# Patient Record
Sex: Female | Born: 1975 | Race: White | Hispanic: No | Marital: Married | Smoking: Former smoker
Health system: Southern US, Community
[De-identification: ages and names within clinical notes are randomized; demographics above are authoritative.]

## PROBLEM LIST (undated history)

## (undated) ENCOUNTER — Inpatient Hospital Stay (HOSPITAL_COMMUNITY): Payer: Self-pay

## (undated) DIAGNOSIS — E079 Disorder of thyroid, unspecified: Secondary | ICD-10-CM

## (undated) DIAGNOSIS — F329 Major depressive disorder, single episode, unspecified: Secondary | ICD-10-CM

## (undated) DIAGNOSIS — E282 Polycystic ovarian syndrome: Secondary | ICD-10-CM

## (undated) DIAGNOSIS — F32A Depression, unspecified: Secondary | ICD-10-CM

## (undated) DIAGNOSIS — E119 Type 2 diabetes mellitus without complications: Secondary | ICD-10-CM

## (undated) DIAGNOSIS — E039 Hypothyroidism, unspecified: Secondary | ICD-10-CM

## (undated) DIAGNOSIS — K219 Gastro-esophageal reflux disease without esophagitis: Secondary | ICD-10-CM

## (undated) DIAGNOSIS — N809 Endometriosis, unspecified: Secondary | ICD-10-CM

## (undated) DIAGNOSIS — N189 Chronic kidney disease, unspecified: Secondary | ICD-10-CM

## (undated) DIAGNOSIS — K589 Irritable bowel syndrome without diarrhea: Secondary | ICD-10-CM

## (undated) DIAGNOSIS — F419 Anxiety disorder, unspecified: Secondary | ICD-10-CM

## (undated) HISTORY — PX: CHOLECYSTECTOMY: SHX55

## (undated) HISTORY — PX: OVARIAN CYST REMOVAL: SHX89

## (undated) HISTORY — PX: THYROIDECTOMY: SHX17

## (undated) HISTORY — PX: TOTAL THYROIDECTOMY: SHX2547

---

## 1998-01-28 ENCOUNTER — Emergency Department (HOSPITAL_COMMUNITY): Admission: EM | Admit: 1998-01-28 | Discharge: 1998-01-28 | Payer: Self-pay | Admitting: Emergency Medicine

## 1998-02-19 ENCOUNTER — Emergency Department (HOSPITAL_COMMUNITY): Admission: EM | Admit: 1998-02-19 | Discharge: 1998-02-19 | Payer: Self-pay | Admitting: Emergency Medicine

## 1998-05-14 ENCOUNTER — Encounter: Payer: Self-pay | Admitting: Emergency Medicine

## 1998-05-14 ENCOUNTER — Emergency Department (HOSPITAL_COMMUNITY): Admission: EM | Admit: 1998-05-14 | Discharge: 1998-05-14 | Payer: Self-pay | Admitting: Emergency Medicine

## 1998-05-18 ENCOUNTER — Emergency Department (HOSPITAL_COMMUNITY): Admission: EM | Admit: 1998-05-18 | Discharge: 1998-05-18 | Payer: Self-pay | Admitting: Emergency Medicine

## 1998-06-30 ENCOUNTER — Encounter: Payer: Self-pay | Admitting: Emergency Medicine

## 1998-06-30 ENCOUNTER — Emergency Department (HOSPITAL_COMMUNITY): Admission: EM | Admit: 1998-06-30 | Discharge: 1998-06-30 | Payer: Self-pay | Admitting: Emergency Medicine

## 1999-06-01 ENCOUNTER — Ambulatory Visit (HOSPITAL_COMMUNITY): Admission: RE | Admit: 1999-06-01 | Discharge: 1999-06-01 | Payer: Self-pay

## 1999-06-24 ENCOUNTER — Other Ambulatory Visit: Admission: RE | Admit: 1999-06-24 | Discharge: 1999-06-24 | Payer: Self-pay | Admitting: *Deleted

## 1999-07-17 ENCOUNTER — Other Ambulatory Visit: Admission: RE | Admit: 1999-07-17 | Discharge: 1999-07-17 | Payer: Self-pay | Admitting: *Deleted

## 1999-07-17 ENCOUNTER — Encounter (INDEPENDENT_AMBULATORY_CARE_PROVIDER_SITE_OTHER): Payer: Self-pay | Admitting: *Deleted

## 1999-10-28 ENCOUNTER — Other Ambulatory Visit: Admission: RE | Admit: 1999-10-28 | Discharge: 1999-10-28 | Payer: Self-pay | Admitting: *Deleted

## 2000-06-04 ENCOUNTER — Emergency Department (HOSPITAL_COMMUNITY): Admission: EM | Admit: 2000-06-04 | Discharge: 2000-06-04 | Payer: Self-pay | Admitting: Emergency Medicine

## 2000-08-16 ENCOUNTER — Other Ambulatory Visit: Admission: RE | Admit: 2000-08-16 | Discharge: 2000-08-16 | Payer: Self-pay | Admitting: Gastroenterology

## 2001-04-07 ENCOUNTER — Emergency Department (HOSPITAL_COMMUNITY): Admission: EM | Admit: 2001-04-07 | Discharge: 2001-04-07 | Payer: Self-pay | Admitting: Emergency Medicine

## 2001-04-07 ENCOUNTER — Encounter: Payer: Self-pay | Admitting: Emergency Medicine

## 2001-10-14 ENCOUNTER — Encounter: Payer: Self-pay | Admitting: Emergency Medicine

## 2001-10-14 ENCOUNTER — Emergency Department (HOSPITAL_COMMUNITY): Admission: EM | Admit: 2001-10-14 | Discharge: 2001-10-14 | Payer: Self-pay | Admitting: *Deleted

## 2004-04-09 ENCOUNTER — Emergency Department (HOSPITAL_COMMUNITY): Admission: EM | Admit: 2004-04-09 | Discharge: 2004-04-10 | Payer: Self-pay | Admitting: Emergency Medicine

## 2004-04-13 ENCOUNTER — Inpatient Hospital Stay (HOSPITAL_COMMUNITY): Admission: AD | Admit: 2004-04-13 | Discharge: 2004-04-14 | Payer: Self-pay | Admitting: Obstetrics & Gynecology

## 2004-04-21 ENCOUNTER — Inpatient Hospital Stay (HOSPITAL_COMMUNITY): Admission: AD | Admit: 2004-04-21 | Discharge: 2004-04-21 | Payer: Self-pay | Admitting: *Deleted

## 2004-04-28 ENCOUNTER — Inpatient Hospital Stay (HOSPITAL_COMMUNITY): Admission: AD | Admit: 2004-04-28 | Discharge: 2004-04-28 | Payer: Self-pay | Admitting: Obstetrics & Gynecology

## 2004-05-05 ENCOUNTER — Inpatient Hospital Stay (HOSPITAL_COMMUNITY): Admission: AD | Admit: 2004-05-05 | Discharge: 2004-05-05 | Payer: Self-pay | Admitting: *Deleted

## 2005-04-04 ENCOUNTER — Inpatient Hospital Stay (HOSPITAL_COMMUNITY): Admission: AD | Admit: 2005-04-04 | Discharge: 2005-04-04 | Payer: Self-pay | Admitting: Family Medicine

## 2007-11-30 ENCOUNTER — Encounter (INDEPENDENT_AMBULATORY_CARE_PROVIDER_SITE_OTHER): Payer: Self-pay | Admitting: Interventional Radiology

## 2007-11-30 ENCOUNTER — Other Ambulatory Visit: Admission: RE | Admit: 2007-11-30 | Discharge: 2007-11-30 | Payer: Self-pay | Admitting: Interventional Radiology

## 2007-11-30 ENCOUNTER — Encounter: Admission: RE | Admit: 2007-11-30 | Discharge: 2007-11-30 | Payer: Self-pay | Admitting: Endocrinology

## 2008-01-12 ENCOUNTER — Ambulatory Visit (HOSPITAL_COMMUNITY): Admission: RE | Admit: 2008-01-12 | Discharge: 2008-01-13 | Payer: Self-pay | Admitting: Surgery

## 2008-01-12 ENCOUNTER — Encounter (INDEPENDENT_AMBULATORY_CARE_PROVIDER_SITE_OTHER): Payer: Self-pay | Admitting: Surgery

## 2008-04-20 ENCOUNTER — Emergency Department (HOSPITAL_COMMUNITY): Admission: EM | Admit: 2008-04-20 | Discharge: 2008-04-20 | Payer: Self-pay | Admitting: Emergency Medicine

## 2010-12-08 NOTE — Op Note (Signed)
NAMESOMALY, MARTENEY           ACCOUNT NO.:  [id number]   MEDICAL RECORD NO.:  [id number]          PATIENT TYPE:  AMB   LOCATION:  DAY                          FACILITY:  [facility]   PHYSICIAN:  Velora Heckler, MD      DATE OF BIRTH:  July 05, 1976   DATE OF PROCEDURE:  01/12/2008  DATE OF DISCHARGE:                               OPERATIVE REPORT   PREOPERATIVE DIAGNOSIS:  Bilateral thyroid nodules, Hashimoto's  thyroiditis.   POSTOPERATIVE DIAGNOSIS:  Bilateral thyroid nodules, Hashimoto's  thyroiditis.   PROCEDURE:  Total thyroidectomy.   SURGEON:  Velora Heckler, MD, FACS   ASSISTANT:  Luretha Murphy, MD, FACS   ANESTHESIA:  General per Dr. Eilene Ghazi.   ESTIMATED BLOOD LOSS:  Minimal.   PREPARATION:  Betadine.   COMPLICATIONS:  None.   INDICATIONS:  The patient is a 35 year old white female from [place], [place].  She is referred by Dr. Ardyth Harps for bilateral  thyroid nodules with a background of Hashimoto's thyroiditis.  The  patient has been on thyroid hormone suppressive therapy.  Nodules have  increased on sequential ultrasound scanning.  Fine-needle aspiration had  shown follicular lesions with Hashimoto's thyroiditis.  The patient is  referred for thyroidectomy by her endocrinologist.   BODY OF REPORT:  Procedure was done in OR #11 at [facility].  The patient was brought to the operating room and placed in  supine position on the operating room table.  Following administration  of general anesthesia the patient was positioned and then prepped and  draped in the usual strict aseptic fashion.  After ascertaining that an  adequate level of anesthesia been achieved, a Kocher incision is made  with a #15 blade.  Dissection was carried down through subcutaneous  tissues and platysma.  Hemostasis was obtained with electrocautery.  Skin flaps were elevated cephalad and caudad from the thyroid notch to  the sternal notch.  A Muller  self-retaining retractor was placed for  exposure.  Strap muscles were incised in the midline and dissection is  carried down to the isthmus of the thyroid.  Strap muscles are reflected  to the left.  Left thyroid lobe was relatively small although it  contained multiple nodules, at least one of which may be calcified.  The  gland was gently mobilized although it was somewhat adherent to the  surrounding tissues.  There was inflammatory change.  There were some  small inflammatory lymph nodes.  The middle vein was divided between  hemoclips with the harmonic scalpel.  Inferior venous tributaries were  divided between hemoclips with the harmonic scalpel.  Superior blood  vessels were dissected out, ligated in continuity with 2-0 silk ties and  divided with the harmonic scalpel.  Both the superior and inferior  parathyroid glands were identified and preserved.  The gland was rolled  anteriorly.  The recurrent nerve was identified and preserved.  The  branches of the inferior thyroid artery were divided between small  hemoclips with the harmonic scalpel.  The gland was mobilized up and  onto the anterior trachea.  A small  pyramidal lobe was dissected out  with electrocautery and included with the specimen on block.   Next, we turned our attention to the right thyroid lobe.  The right lobe  was larger.  It contained dominant nodules at least one of which may be  calcified.  Strap muscles were reflected laterally.  Middle vein was  divided between medium hemoclips with the harmonic scalpel.  Inferior  venous tributaries were divided between medium hemoclips with the  harmonic scalpel.  Superior pole vessels were dissected out and divided  between medium hemoclips with the harmonic scalpel.  The gland was  rolled anteriorly.  There was a prominent tubercle of Zuckerkandl which  extended posteriorly.  It was gently dissected out.  Parathyroid tissue  adjacent to this tubercle was preserved on  its vascular pedicle.  Branches of the inferior thyroid artery were divided between small  hemoclips.  The gland was rolled further anteriorly.  The ligament of  Allyson Sabal was transected with electrocautery.  The gland was excised off the  anterior trachea wall using the electrocautery.  The entire gland was  excised.  A suture was used to mark the right superior pole.  The neck  is irrigated with warm saline and Surgicel was placed in the operative  field.  Strap muscles were reapproximated in the midline with  interrupted 3-0 Vicryl sutures.  Platysma was closed with interrupted 3-  0 Vicryl sutures.  Skin was closed with running 4-0 Monocryl  subcuticular suture.  The wound was washed and dried and Benzoin and  Steri-Strips are applied.  Sterile dressings were applied.  The patient  was awakened from anesthesia and brought to the recovery room in stable  condition.  The patient tolerated the procedure well.      Velora Heckler, MD  Electronically Signed     TMG/MEDQ  D:  01/12/2008  T:  01/12/2008  Job:  [id number]   cc:   Jeannett Senior A. Evlyn Kanner, M.D.  Fax: [phone]   Velora Heckler, MD  [phone] [address] [place]  [place] [address]

## 2011-04-22 LAB — URINALYSIS, ROUTINE W REFLEX MICROSCOPIC
Glucose, UA: NEGATIVE
Leukocytes, UA: NEGATIVE
pH: 6

## 2011-04-22 LAB — COMPREHENSIVE METABOLIC PANEL
ALT: 44 — ABNORMAL HIGH
AST: 32
Albumin: 3.7
Calcium: 9.1
GFR calc Af Amer: >60
Glucose, Bld: 94
Sodium: 138
Total Protein: 6.5

## 2011-04-22 LAB — DIFFERENTIAL
Eosinophils Absolute: 0.3
Lymphs Abs: 3.7
Monocytes Relative: 5
Neutrophils Relative %: 49

## 2011-04-22 LAB — URINE MICROSCOPIC-ADD ON

## 2011-04-22 LAB — CBC
MCHC: 34.7
Platelets: 248
RDW: 13.5

## 2011-04-22 LAB — PROTIME-INR: INR: 0.9

## 2011-07-25 ENCOUNTER — Emergency Department (HOSPITAL_COMMUNITY)
Admission: EM | Admit: 2011-07-25 | Discharge: 2011-07-26 | Disposition: A | Discharge status: Home / Self Care | Payer: Self-pay | Attending: Emergency Medicine | Admitting: Emergency Medicine

## 2011-07-25 ENCOUNTER — Encounter: Payer: Self-pay | Admitting: Emergency Medicine

## 2011-07-25 DIAGNOSIS — Z79899 Other long term (current) drug therapy: Secondary | ICD-10-CM | POA: Insufficient documentation

## 2011-07-25 DIAGNOSIS — E119 Type 2 diabetes mellitus without complications: Secondary | ICD-10-CM | POA: Insufficient documentation

## 2011-07-25 DIAGNOSIS — R10819 Abdominal tenderness, unspecified site: Secondary | ICD-10-CM | POA: Insufficient documentation

## 2011-07-25 DIAGNOSIS — F17200 Nicotine dependence, unspecified, uncomplicated: Secondary | ICD-10-CM | POA: Insufficient documentation

## 2011-07-25 DIAGNOSIS — Z9889 Other specified postprocedural states: Secondary | ICD-10-CM | POA: Insufficient documentation

## 2011-07-25 DIAGNOSIS — R109 Unspecified abdominal pain: Secondary | ICD-10-CM | POA: Insufficient documentation

## 2011-07-25 HISTORY — DX: Polycystic ovarian syndrome: E28.2

## 2011-07-25 NOTE — ED Notes (Signed)
Irregular periods- states she has had vaginal bleeding for 2 weeks and it was heavier today.

## 2011-07-25 NOTE — ED Notes (Signed)
C/o RLQ pain since waking up at 0830.  Intermittent nausea.  Denies vomiting.  Denies urinary complaints.

## 2011-07-25 NOTE — ED Notes (Signed)
Pt states began having RLQ pain this morning. Pt states pain has increased throughout the day. Pt states pain now radiates to right flank. Pt c/o intermittent nausea. Pt also c/o having her period for 2 weeks.

## 2011-07-25 NOTE — ED Provider Notes (Signed)
History     CSN: [id number]  Arrival date & time 07/25/11  2210   First MD Initiated Contact with Patient 07/25/11 2336      Chief Complaint  Patient presents with  . Abdominal Pain    (Consider location/radiation/quality/duration/timing/severity/associated sxs/prior treatment) HPI History provided by pt.   Pt woke this am w/ constant, severe RLQ pain that radiates to the right flank.  Has an intermittent stabbing sensation.   No associated fever, N/V/D, anorexia, urinary sx.  Has had her period for the past 2 weeks and bleeding heavier today, but otherwise no vaginal sx.  Periods are always irregular.  Has never had this pain before.  Past abd surgeries include cholecystectomy and ovarian cyst removal.  Had the ovarian cyst 25yrs ago and is unsure whether or not current sx similar.   Past Medical History  Diagnosis Date  . PCOS (polycystic ovarian syndrome)   . Diabetes mellitus     Past Surgical History  Procedure Date  . Cholecystectomy   . Total thyroidectomy   . Ovarian cyst removal     No family history on file.  History  Substance Use Topics  . Smoking status: Current Everyday Smoker  . Smokeless tobacco: Not on file  . Alcohol Use: Yes    OB History    Grav Para Term Preterm Abortions TAB SAB Ect Mult Living                  Review of Systems  All other systems reviewed and are negative.    Allergies  Demerol  Home Medications   Current Outpatient Rx  Name Route Sig Dispense Refill  . DIPHENHYDRAMINE HCL 25 MG PO TABS Oral Take 50 mg by mouth at bedtime.      Marland Kitchen LEVOTHYROXINE SODIUM 150 MCG PO TABS Oral Take 150 mcg by mouth daily.      Marland Kitchen METFORMIN HCL 1000 MG PO TABS Oral Take 1,000 mg by mouth 2 (two) times daily with a meal.        BP 120/86  Pulse 86  Temp(Src) 98 F (36.7 C) (Oral)  Resp 20  SpO2 99%  LMP 07/11/2011  Physical Exam  Nursing note and vitals reviewed. Constitutional: She is oriented to person, place, and time. She  appears well-developed and well-nourished. No distress.  HENT:  Head: Normocephalic and atraumatic.  Eyes:       Normal appearance  Neck: Normal range of motion.  Cardiovascular: Normal rate and regular rhythm.   Pulmonary/Chest: Effort normal and breath sounds normal.  Abdominal: Soft. Bowel sounds are normal. She exhibits no distension and no mass. There is no rebound and no guarding.       Mild RLQ and mod right suprapubic ttp.  Mild R CVA ttp  Genitourinary:       Nml external genitalia.  Vaginal bleeding.  Cervix closed and appears nml.  No cervical motion tenderness.  Mild R CVA ttp.   Neurological: She is alert and oriented to person, place, and time.  Skin: Skin is warm and dry. No rash noted.  Psychiatric: She has a normal mood and affect. Her behavior is normal.    ED Course  Procedures (including critical care time)  Labs Reviewed  WET PREP, GENITAL - Abnormal; Notable for the following:    Clue Cells, Wet Prep FEW (*)    All other components within normal limits  URINALYSIS, ROUTINE W REFLEX MICROSCOPIC - Abnormal; Notable for the following:    APPearance  CLOUDY (*)    Hgb urine dipstick LARGE (*)    All other components within normal limits  URINE MICROSCOPIC-ADD ON - Abnormal; Notable for the following:    Squamous Epithelial / LPF FEW (*)    All other components within normal limits  POCT PREGNANCY, URINE  GC/CHLAMYDIA PROBE AMP, GENITAL  POCT PREGNANCY, URINE  CBC  I-STAT, CHEM 8   US Transvaginal Non-ob  07/26/2011  *RADIOLOGY REPORT*  Clinical Data: Right suprapubic pain.  Right lower quadrant pain. Bleeding for 2 weeks.  Gravida 2, para 0.  LMP 07/10/2011.  TRANSABDOMINAL AND TRANSVAGINAL ULTRASOUND OF PELVIS Technique:  Both transabdominal and transvaginal ultrasound examinations of the pelvis were performed. Transabdominal technique was performed for global imaging of the pelvis including uterus, ovaries, adnexal regions, and pelvic cul-de-sac.  Comparison:  OB ultrasound 04/14/2004   It was necessary to proceed with endovaginal exam following the transabdominal exam to visualize the uterus and ovaries.  Findings:  Uterus: The uterus is 6.6 x 2.9 x 3.5 cm.  No uterine mass identified.  Endometrium: The endometrium is 6.4 mm.  No focal lesion identified.  Right ovary:  The right ovary is 3.4 x 2.9 x 2.8 cm.  A right ovarian follicle is 2.7 cm.  Left ovary: Left ovary is 3.1 x 2.2 x 3.2 cm.  Follicle is 1.5 cm.  Other findings: There is a trace of free pelvic fluid.  IMPRESSION:  1.Normal pelvic ultrasound. 2.  No evidence for adnexal mass or significant free pelvic fluid.  Original Report Authenticated By: Patterson Hammersmith, M.D.   US Pelvis Complete  07/26/2011  *RADIOLOGY REPORT*  Clinical Data: Right suprapubic pain.  Right lower quadrant pain. Bleeding for 2 weeks.  Gravida 2, para 0.  LMP 07/10/2011.  TRANSABDOMINAL AND TRANSVAGINAL ULTRASOUND OF PELVIS Technique:  Both transabdominal and transvaginal ultrasound examinations of the pelvis were performed. Transabdominal technique was performed for global imaging of the pelvis including uterus, ovaries, adnexal regions, and pelvic cul-de-sac.  Comparison: OB ultrasound 04/14/2004   It was necessary to proceed with endovaginal exam following the transabdominal exam to visualize the uterus and ovaries.  Findings:  Uterus: The uterus is 6.6 x 2.9 x 3.5 cm.  No uterine mass identified.  Endometrium: The endometrium is 6.4 mm.  No focal lesion identified.  Right ovary:  The right ovary is 3.4 x 2.9 x 2.8 cm.  A right ovarian follicle is 2.7 cm.  Left ovary: Left ovary is 3.1 x 2.2 x 3.2 cm.  Follicle is 1.5 cm.  Other findings: There is a trace of free pelvic fluid.  IMPRESSION:  1.Normal pelvic ultrasound. 2.  No evidence for adnexal mass or significant free pelvic fluid.  Original Report Authenticated By: Patterson Hammersmith, M.D.     No diagnosis found.    MDM  Pt presents w/ RLQ pain w/ associated vaginal  bleeding.  H/o large, surgically resected ovarian cyst.  RLQ, R suprapubic and R adnexal ttp on exam.  Transvaginal US neg.  CT abd/pelvis pending to r/o appendicitis.  Will give patient a second dose of pain medication.  Dr. Hyacinth Meeker to dispo.         Otilio Miu, [place] 07/27/11 [phone]

## 2011-07-26 ENCOUNTER — Emergency Department (HOSPITAL_COMMUNITY): Payer: Self-pay

## 2011-07-26 ENCOUNTER — Encounter (HOSPITAL_COMMUNITY): Payer: Self-pay | Admitting: Radiology

## 2011-07-26 LAB — URINALYSIS, ROUTINE W REFLEX MICROSCOPIC
Bilirubin Urine: NEGATIVE
Specific Gravity, Urine: 1.02 (ref 1.005–1.030)
Urobilinogen, UA: 0.2 mg/dL (ref 0.0–1.0)
pH: 6 (ref 5.0–8.0)

## 2011-07-26 LAB — POCT I-STAT, CHEM 8
BUN: 15 mg/dL (ref 6–23)
Chloride: 107 mEq/L (ref 96–112)
HCT: 40 % (ref 36.0–46.0)
Potassium: 3.8 mEq/L (ref 3.5–5.1)

## 2011-07-26 LAB — URINE MICROSCOPIC-ADD ON

## 2011-07-26 LAB — CBC
Platelets: 214 10*3/uL (ref 150–400)
RBC: 4.52 MIL/uL (ref 3.87–5.11)
WBC: 10 10*3/uL (ref 4.0–10.5)

## 2011-07-26 LAB — WET PREP, GENITAL
Trich, Wet Prep: NONE SEEN
Yeast Wet Prep HPF POC: NONE SEEN

## 2011-07-26 LAB — GC/CHLAMYDIA PROBE AMP, GENITAL: GC Probe Amp, Genital: NEGATIVE

## 2011-07-26 MED ORDER — NAPROXEN 500 MG PO TABS: 500.0000 mg | ORAL_TABLET | Freq: Two times a day (BID) | ORAL | Status: DC

## 2011-07-26 MED ORDER — MORPHINE SULFATE 4 MG/ML IJ SOLN
4.0000 mg | Freq: Once | INTRAMUSCULAR | Status: AC
  Administered 2011-07-26: 4 mg via INTRAVENOUS
  Filled 2011-07-26 (×2): qty 1

## 2011-07-26 MED ORDER — ONDANSETRON HCL 4 MG/2ML IJ SOLN
INTRAMUSCULAR | Status: AC
  Filled 2011-07-26: qty 2

## 2011-07-26 MED ORDER — OXYCODONE-ACETAMINOPHEN 5-325 MG PO TABS
1.0000 | ORAL_TABLET | Freq: Once | ORAL | Status: AC
  Administered 2011-07-26: 1 via ORAL
  Filled 2011-07-26: qty 1

## 2011-07-26 MED ORDER — ONDANSETRON HCL 4 MG/2ML IJ SOLN
INTRAMUSCULAR | Status: AC
  Administered 2011-07-26: 4 mg
  Filled 2011-07-26: qty 2

## 2011-07-26 MED ORDER — OXYCODONE-ACETAMINOPHEN 5-325 MG PO TABS: 1.0000 | ORAL_TABLET | ORAL | Status: AC | PRN

## 2011-07-26 MED ORDER — PROMETHAZINE HCL 25 MG PO TABS: 25.0000 mg | ORAL_TABLET | Freq: Four times a day (QID) | ORAL | Status: AC | PRN

## 2011-07-26 MED ORDER — IOHEXOL 300 MG/ML  SOLN
100.0000 mL | Freq: Once | INTRAMUSCULAR | Status: AC | PRN
  Administered 2011-07-26: 100 mL via INTRAVENOUS

## 2011-07-26 NOTE — ED Notes (Signed)
Pt doesn't want pain meds at this. Pt concerned pain meds will make her sick. Pt attempting to drink contrast at this time.

## 2011-07-26 NOTE — ED Notes (Signed)
Pt still attempting to drink contrast.

## 2011-07-26 NOTE — ED Notes (Signed)
Patient transported to Ultrasound 

## 2011-07-27 NOTE — ED Provider Notes (Signed)
Medical screening examination/treatment/procedure(s) were conducted as a shared visit with non-physician practitioner(s) and myself.  I personally evaluated the patient during the encounter  I have fully evaluated the patient and found her to have no significant findings of pathologic abdominal pain. Her abdominal exam is benign at this time her pain has improved during her course in the emergency department. She has been informed of her CT scan and ultrasound results as well as blood and urine results. I believe that her pain may have been from a kidney stone which has passed.  Otherwise patient appears well with normal lung and heart exam, soft and nontender abdomen and at this time patient is stable for discharge     Vida Roller, MD 07/27/11 [phone]

## 2012-01-28 ENCOUNTER — Encounter (HOSPITAL_COMMUNITY): Payer: Self-pay | Admitting: *Deleted

## 2012-01-28 ENCOUNTER — Inpatient Hospital Stay (HOSPITAL_COMMUNITY)
Admission: AD | Admit: 2012-01-28 | Discharge: 2012-01-28 | Disposition: A | Discharge status: Home / Self Care | Payer: BC Managed Care – PPO | Source: Ambulatory Visit | Attending: Obstetrics & Gynecology | Admitting: Obstetrics & Gynecology

## 2012-01-28 DIAGNOSIS — R109 Unspecified abdominal pain: Secondary | ICD-10-CM | POA: Insufficient documentation

## 2012-01-28 DIAGNOSIS — N949 Unspecified condition associated with female genital organs and menstrual cycle: Secondary | ICD-10-CM

## 2012-01-28 DIAGNOSIS — R102 Pelvic and perineal pain: Secondary | ICD-10-CM

## 2012-01-28 DIAGNOSIS — N979 Female infertility, unspecified: Secondary | ICD-10-CM | POA: Insufficient documentation

## 2012-01-28 HISTORY — DX: Major depressive disorder, single episode, unspecified: F32.9

## 2012-01-28 HISTORY — DX: Depression, unspecified: F32.A

## 2012-01-28 HISTORY — DX: Chronic kidney disease, unspecified: N18.9

## 2012-01-28 HISTORY — DX: Anxiety disorder, unspecified: F41.9

## 2012-01-28 HISTORY — DX: Endometriosis, unspecified: N80.9

## 2012-01-28 LAB — URINALYSIS, ROUTINE W REFLEX MICROSCOPIC
Leukocytes, UA: NEGATIVE
Nitrite: NEGATIVE
Specific Gravity, Urine: >1.03 — ABNORMAL HIGH (ref 1.005–1.030)
pH: 5.5 (ref 5.0–8.0)

## 2012-01-28 LAB — POCT PREGNANCY, URINE: Preg Test, Ur: NEGATIVE

## 2012-01-28 LAB — HCG, SERUM, QUALITATIVE: Preg, Serum: NEGATIVE

## 2012-01-28 MED ORDER — IBUPROFEN 600 MG PO TABS
600.0000 mg | ORAL_TABLET | Freq: Once | ORAL | Status: AC
  Administered 2012-01-28: 600 mg via ORAL
  Filled 2012-01-28: qty 1

## 2012-01-28 NOTE — MAU Note (Signed)
This weekend is end of 2 wks wait to see if preg.  Has been cramping last couple days, became severe today.  Has been seeing fertility dr, on fertility meds.

## 2012-01-28 NOTE — MAU Provider Note (Signed)
History     CSN: [id number]  Arrival date and time: 01/28/12 1452   First Provider Initiated Contact with Patient 01/28/12 1549      Chief Complaint  Patient presents with  . Abdominal Pain   HPI This is a 36 y.o. female who presents with cramping. She is currently undergoing fertility treatment with Dr Elesa Hacker.  She took Femara this month and is utilizing natuaral intercourse. Had HCG shot 2 wks ago. Due to take her UPT tomorrow. No bleeding. No fever or other problems. Was just worried she was having a miscarriage.  OB History    Grav Para Term Preterm Abortions TAB SAB Ect Mult Living   2 0 0 0 2 0 2 0 0 0       Past Medical History  Diagnosis Date  . PCOS (polycystic ovarian syndrome)   . 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 Diabetes mellitus     insulin resistance w/ PCOS  . Chronic kidney disease     kidney stones  . Anxiety   . Depression     doing good, no meds in 2 yrs.  . Endometriosis     Past Surgical History  Procedure Date  . Cholecystectomy   . Total thyroidectomy   . Ovarian cyst removal     Family History  Problem Relation Age of Onset  . Other Neg Hx     History  Substance Use Topics  . Smoking status: Current Everyday Smoker -- 0.2 packs/day for 15 years    Types: Cigarettes  . Smokeless tobacco: Never Used  . Alcohol Use: Yes     not now    Allergies:  Allergies  Allergen Reactions  . Demerol Anaphylaxis  . Hydrocodone Nausea Only  . Percocet (Oxycodone-Acetaminophen) Nausea Only  . Tramadol Nausea Only    Prescriptions prior to admission  Medication Sig Dispense Refill  . acetaminophen (TYLENOL) 500 MG tablet Take 1,000 mg by mouth daily as needed. cramps      . human chorionic gonadotropin (PREGNYL/NOVAREL) 10000 UNITS injection Inject 10,000 Units into the muscle once. Two weeks after period.      Marland Kitchen letrozole (FEMARA) 2.5 MG tablet Take 5 mg by mouth daily. Day 3 of period start for 5 days each month      .  levothyroxine (SYNTHROID, LEVOTHROID) 175 MCG tablet Take 175 mcg by mouth daily.      . Prenatal Vit-Fe Fumarate-FA (PRENATAL MULTIVITAMIN) TABS Take 1 tablet by mouth at bedtime.      . progesterone (PROMETRIUM) 200 MG capsule Take 200 mg by mouth at bedtime. Until positive pregnancy, or until period starts.        ROS As listed inHPI  Physical Exam   Blood pressure 120/84, pulse 87, temperature 99.8 F (37.7 C), temperature source Oral, resp. rate 20, height 5' 6.5" (1.689 m), weight 221 lb (100.245 kg), last menstrual period 01/02/2012.  Physical Exam  Constitutional: She is oriented to person, place, and time. She appears well-developed and well-nourished. No distress.  Cardiovascular: Normal rate.   Respiratory: Effort normal.  GI: Soft. There is no tenderness.  Genitourinary: Vagina normal and uterus normal. No vaginal discharge found.       Cervix long and closed  Musculoskeletal: Normal range of motion.  Neurological: She is alert and oriented to person, place, and time.  Skin: Skin is warm and dry.  Psychiatric: She has a normal mood and affect.   UPT neg.  MAU  Course  Procedures  MDM Will get serum Qualitative HCG >> Negative  Assessment and Plan  A;  Infertility, negative pregnancy test      Cramping, probably oncoming menses  P:  Discharge home      Follow up with Dr Clint Bolder 01/28/2012, 3:49 PM

## 2012-01-28 NOTE — MAU Note (Deleted)
Fetal movement noted by RN when placing on monitor, asked pt- no response

## 2012-01-30 NOTE — MAU Provider Note (Signed)
Attestation of Attending Supervision of Advanced Practitioner (CNM/NP): Evaluation and management procedures were performed by the Advanced Practitioner under my supervision and collaboration.  I have reviewed the Advanced Practitioner's note and chart, and I agree with the management and plan.  Jaynie Collins, M.D. 01/30/2012 7:45 AM

## 2012-05-18 ENCOUNTER — Encounter (HOSPITAL_COMMUNITY): Payer: Self-pay | Admitting: *Deleted

## 2012-05-18 ENCOUNTER — Inpatient Hospital Stay (HOSPITAL_COMMUNITY)
Admission: AD | Admit: 2012-05-18 | Discharge: 2012-05-18 | Disposition: A | Discharge status: Home / Self Care | Payer: BC Managed Care – PPO | Source: Ambulatory Visit | Attending: Obstetrics and Gynecology | Admitting: Obstetrics and Gynecology

## 2012-05-18 DIAGNOSIS — R350 Frequency of micturition: Secondary | ICD-10-CM

## 2012-05-18 DIAGNOSIS — N949 Unspecified condition associated with female genital organs and menstrual cycle: Secondary | ICD-10-CM | POA: Insufficient documentation

## 2012-05-18 DIAGNOSIS — O99891 Other specified diseases and conditions complicating pregnancy: Secondary | ICD-10-CM | POA: Insufficient documentation

## 2012-05-18 HISTORY — DX: Hypothyroidism, unspecified: E03.9

## 2012-05-18 LAB — URINALYSIS, ROUTINE W REFLEX MICROSCOPIC
Glucose, UA: NEGATIVE mg/dL
Ketones, ur: NEGATIVE mg/dL
Leukocytes, UA: NEGATIVE
Nitrite: NEGATIVE
Protein, ur: NEGATIVE mg/dL
Urobilinogen, UA: 0.2 mg/dL (ref 0.0–1.0)

## 2012-05-18 NOTE — MAU Note (Addendum)
Pelvic pressure first noted on Monday.  Reports frequency with urination, no pain.  No bleeding.  Worried due to hx of miscarriage.  Has had 7 pos HPT.

## 2012-05-18 NOTE — MAU Provider Note (Signed)
Attestation of Attending Supervision of Advanced Practitioner (CNM/NP): Evaluation and management procedures were performed by the Advanced Practitioner under my supervision and collaboration.  I have reviewed the Advanced Practitioner's note and chart, and I agree with the management and plan.  Shabrea Weldin 05/18/2012 4:36 PM   

## 2012-05-18 NOTE — MAU Provider Note (Signed)
History     CSN: [id number]  Arrival date and time: 05/18/12 4098   First Provider Initiated Contact with Patient 05/18/12 1105      Chief Complaint  Patient presents with  . pelvic pressure    HPIJennifer B Scott is 36 y.o. G3P0020 [redacted]w[redacted]d weeks presenting with pressure, sharp twinges and frequency of urination. She has a history of miscarriages X2 "about this time".  She is concerned about that possibility of miscarriage or UTI. She is here to rule out UTI.  SHe has had 8 + pregnancy tests at home. LMP 04/20/12.  Normal menses for her.  She is a patient of Dr. Elesa Hacker.  Was seen in their lab today for BHCG and progesterone levels.   She is using Progesterone 200mg  po at hs.  Negative for vaginal bleeding or abnormal discharge.   Past Medical History  Diagnosis Date  . PCOS (polycystic ovarian syndrome)   . Diabetes mellitus     insulin resistance w/ PCOS  . Chronic kidney disease     kidney stones  . Anxiety   . Depression     doing good, no meds in 2 yrs.  . Endometriosis   . Hypothyroid     Past Surgical History  Procedure Date  . Cholecystectomy   . Total thyroidectomy   . Ovarian cyst removal     Family History  Problem Relation Age of Onset  . Other Neg Hx   . Diabetes Mother   . Hypertension Father     History  Substance Use Topics  . Smoking status: Current Every Day Smoker -- 0.2 packs/day for 15 years    Types: Cigarettes  . Smokeless tobacco: Never Used  . Alcohol Use: Yes     not now    Allergies:  Allergies  Allergen Reactions  . Demerol Anaphylaxis  . Hydrocodone Nausea Only  . Percocet (Oxycodone-Acetaminophen) Nausea Only  . Tramadol Nausea Only    Prescriptions prior to admission  Medication Sig Dispense Refill  . acetaminophen (TYLENOL) 500 MG tablet Take 1,000 mg by mouth daily as needed. cramps      . human chorionic gonadotropin (PREGNYL/NOVAREL) 10000 UNITS injection Inject 10,000 Units into the muscle once. Two weeks after  period. Last used 05-01-12      . letrozole (FEMARA) 2.5 MG tablet Take 5 mg by mouth daily. Day 3 of period start for 5 days each month sept 28 and took for 5 days      . levothyroxine (SYNTHROID, LEVOTHROID) 175 MCG tablet Take 175 mcg by mouth daily.      . Prenatal Vit-Fe Fumarate-FA (PRENATAL MULTIVITAMIN) TABS Take 1 tablet by mouth at bedtime.      . progesterone (PROMETRIUM) 200 MG capsule Take 200 mg by mouth at bedtime. Until positive pregnancy, or until period starts. Took Oct 10,2013 and still taking        Review of Systems  Constitutional: Negative for fever and chills.  HENT: Negative.   Gastrointestinal: Positive for abdominal pain (sharp twinges occasionally).  Genitourinary: Positive for frequency. Negative for dysuria, urgency and hematuria.   Physical Exam   Blood pressure 125/81, pulse 80, temperature 98.4 F (36.9 C), temperature source Oral, resp. rate 18, height 5\' 7"  (1.702 m), weight 100.245 kg (221 lb), last menstrual period 04/20/2012.  Physical Exam  Constitutional: She is oriented to person, place, and time. She appears well-developed and well-nourished. No distress.  HENT:  Head: Normocephalic.  Neck: Normal range of motion.  Cardiovascular: Normal  rate.   Respiratory: Effort normal.  GI: Soft. She exhibits no distension and no mass. There is tenderness ("pressure"  feeling over the bladder). There is no rebound and no guarding.  Genitourinary:       Not indicated  Neurological: She is alert and oriented to person, place, and time.  Skin: Skin is warm and dry.  Psychiatric: She has a normal mood and affect. Her behavior is normal.   Results for orders placed during the hospital encounter of 05/18/12 (from the past 24 hour(s))  URINALYSIS, ROUTINE W REFLEX MICROSCOPIC     Status: Abnormal   Collection Time   05/18/12  9:57 AM      Component Value Range   Color, Urine YELLOW  YELLOW   APPearance CLEAR  CLEAR   Specific Gravity, Urine >1.030 (*) 1.005  - 1.030   pH 6.0  5.0 - 8.0   Glucose, UA NEGATIVE  NEGATIVE mg/dL   Hgb urine dipstick NEGATIVE  NEGATIVE   Bilirubin Urine NEGATIVE  NEGATIVE   Ketones, ur NEGATIVE  NEGATIVE mg/dL   Protein, ur NEGATIVE  NEGATIVE mg/dL   Urobilinogen, UA 0.2  0.0 - 1.0 mg/dL   Nitrite NEGATIVE  NEGATIVE   Leukocytes, UA NEGATIVE  NEGATIVE  POCT PREGNANCY, URINE     Status: Abnormal   Collection Time   05/18/12 11:18 AM      Component Value Range   Preg Test, Ur POSITIVE (*) NEGATIVE   MAU Course  Procedures  MDM Reviewed UA with patient.  Reassured not UTI  Assessment and Plan  A:  Negative Urinalysis  P:  Encouraged patient to continue care with Dr. Elesa Hacker until he refers her to [facility] here.       Madeline Scott,EVE M 05/18/2012, 11:52 AM

## 2012-05-28 ENCOUNTER — Encounter (HOSPITAL_COMMUNITY): Payer: Self-pay | Admitting: *Deleted

## 2012-05-28 ENCOUNTER — Inpatient Hospital Stay (HOSPITAL_COMMUNITY)
Admission: AD | Admit: 2012-05-28 | Discharge: 2012-05-28 | Disposition: A | Discharge status: Home / Self Care | Payer: BC Managed Care – PPO | Source: Ambulatory Visit | Attending: Obstetrics & Gynecology | Admitting: Obstetrics & Gynecology

## 2012-05-28 ENCOUNTER — Inpatient Hospital Stay (HOSPITAL_COMMUNITY): Payer: BC Managed Care – PPO

## 2012-05-28 DIAGNOSIS — O99891 Other specified diseases and conditions complicating pregnancy: Secondary | ICD-10-CM | POA: Insufficient documentation

## 2012-05-28 DIAGNOSIS — R109 Unspecified abdominal pain: Secondary | ICD-10-CM

## 2012-05-28 DIAGNOSIS — O26899 Other specified pregnancy related conditions, unspecified trimester: Secondary | ICD-10-CM

## 2012-05-28 DIAGNOSIS — R1031 Right lower quadrant pain: Secondary | ICD-10-CM | POA: Insufficient documentation

## 2012-05-28 LAB — CBC WITH DIFFERENTIAL/PLATELET
Basophils Absolute: 0.1 10*3/uL (ref 0.0–0.1)
HCT: 39.1 % (ref 36.0–46.0)
Lymphocytes Relative: 37 % (ref 12–46)
Monocytes Absolute: 0.5 10*3/uL (ref 0.1–1.0)
Neutro Abs: 7.4 10*3/uL (ref 1.7–7.7)
RDW: 13.4 % (ref 11.5–15.5)
WBC: 13.2 10*3/uL — ABNORMAL HIGH (ref 4.0–10.5)

## 2012-05-28 LAB — WET PREP, GENITAL: Yeast Wet Prep HPF POC: NONE SEEN

## 2012-05-28 LAB — HCG, QUANTITATIVE, PREGNANCY: hCG, Beta Chain, Quant, S: 2706 m[IU]/mL — ABNORMAL HIGH (ref <5)

## 2012-05-28 NOTE — MAU Provider Note (Signed)
History     CSN: [id number]  Arrival date and time: 05/28/12 2043   None     Chief Complaint  Patient presents with  . Abdominal Pain   HPI Madeline Scott is a 36 y.o. female @ [redacted]w[redacted]d gestation who presents to MAU with abdominal pain. The pain started today. The pain is located in the right lower quadrant. She rates the pain as 7/10. She denies vaginal bleeding or discharge. She is concerned because she has had 2 miscarriages. She goes to an infertility office. She is currently taking Progesterone. The history was provided by the patient.  OB History    Grav Para Term Preterm Abortions TAB SAB Ect Mult Living   3 0 0 0 2 0 2 0 0 0       Past Medical History  Diagnosis Date  . PCOS (polycystic ovarian syndrome)   . Diabetes mellitus     insulin resistance w/ PCOS  . Chronic kidney disease     kidney stones  . Anxiety   . Depression     doing good, no meds in 2 yrs.  . Endometriosis   . Hypothyroid     Past Surgical History  Procedure Date  . Cholecystectomy   . Total thyroidectomy   . Ovarian cyst removal     Family History  Problem Relation Age of Onset  . Other Neg Hx   . Diabetes Mother   . Hypertension Father     History  Substance Use Topics  . Smoking status: Former Smoker -- 0.2 packs/day for 15 years    Types: Cigarettes    Quit date: 05/16/2012  . Smokeless tobacco: Never Used  . Alcohol Use: Yes     Comment: not now    Allergies:  Allergies  Allergen Reactions  . Demerol Anaphylaxis  . Hydrocodone Nausea Only  . Percocet (Oxycodone-Acetaminophen) Nausea Only  . Tramadol Nausea Only    Prescriptions prior to admission  Medication Sig Dispense Refill  . acetaminophen (TYLENOL) 500 MG tablet Take 500 mg by mouth daily as needed. cramps      . amoxicillin (AMOXIL) 500 MG capsule Take 500 mg by mouth 3 (three) times daily. Started 05/27/12 x 10 days      . levothyroxine (SYNTHROID, LEVOTHROID) 175 MCG tablet Take 175 mcg by mouth daily.       . Prenatal Vit-Fe Fumarate-FA (PRENATAL MULTIVITAMIN) TABS Take 1 tablet by mouth at bedtime.      . progesterone (PROMETRIUM) 200 MG capsule Take 400 mg by mouth at bedtime. Until positive pregnancy, or until period starts. Took Oct 10,2013 and still taking        Review of Systems  Constitutional: Negative for fever and chills.  Eyes: Negative for blurred vision and double vision.  Respiratory: Negative for cough and wheezing.   Cardiovascular: Negative for chest pain.  Gastrointestinal: Positive for abdominal pain. Negative for vomiting and diarrhea.  Genitourinary: Positive for frequency. Negative for dysuria and urgency.  Musculoskeletal: Negative for back pain.  Skin: Negative for rash.  Neurological: Negative for dizziness and headaches.  Psychiatric/Behavioral: Negative for depression. The patient is not nervous/anxious.    Physical Exam   Blood pressure 130/67, pulse 107, temperature 97.9 F (36.6 C), temperature source Oral, resp. rate 18, height 5' 6.6" (1.692 m), weight 226 lb 6.4 oz (102.694 kg), last menstrual period 04/20/2012.  Physical Exam  Nursing note and vitals reviewed. Constitutional: She is oriented to person, place, and time. She appears well-developed and  well-nourished. No distress.  HENT:  Head: Normocephalic and atraumatic.  Eyes: EOM are normal.  Neck: Neck supple.  Cardiovascular: Normal rate.   Respiratory: Effort normal.  GI: Soft. Normal appearance. There is tenderness in the right lower quadrant. There is no rigidity, no rebound and no guarding.  Genitourinary:       External genitalia without lesions. Mucous discharge vaginal vault. No bleeding noted. Cervix closed, long, no CMT, right adnexal tenderness. Uterus without palpable enlargement.  Musculoskeletal: Normal range of motion.  Neurological: She is alert and oriented to person, place, and time.  Skin: Skin is warm and dry.  Psychiatric: She has a normal mood and affect. Her behavior is  normal. Judgment and thought content normal.   Results for orders placed during the hospital encounter of 05/28/12 (from the past 24 hour(s))  CBC WITH DIFFERENTIAL     Status: Abnormal   Collection Time   05/28/12  9:05 PM      Component Value Range   WBC 13.2 (*) 4.0 - 10.5 K/uL   RBC 4.52  3.87 - 5.11 MIL/uL   Hemoglobin 13.8  12.0 - 15.0 g/dL   HCT 96.0  45.4 - 09.8 %   MCV 86.5  78.0 - 100.0 fL   MCH 30.5  26.0 - 34.0 pg   MCHC 35.3  30.0 - 36.0 g/dL   RDW 11.9  14.7 - 82.9 %   Platelets 265  150 - 400 K/uL   Neutrophils Relative 56  43 - 77 %   Neutro Abs 7.4  1.7 - 7.7 K/uL   Lymphocytes Relative 37  12 - 46 %   Lymphs Abs 4.9 (*) 0.7 - 4.0 K/uL   Monocytes Relative 4  3 - 12 %   Monocytes Absolute 0.5  0.1 - 1.0 K/uL   Eosinophils Relative 3  0 - 5 %   Eosinophils Absolute 0.4  0.0 - 0.7 K/uL   Basophils Relative 0  0 - 1 %   Basophils Absolute 0.1  0.0 - 0.1 K/uL  HCG, QUANTITATIVE, PREGNANCY     Status: Abnormal   Collection Time   05/28/12  9:05 PM      Component Value Range   hCG, Beta Chain, Quant, S 2706 (*) <5 mIU/mL  ABO/RH     Status: Normal (Preliminary result)   Collection Time   05/28/12  9:05 PM      Component Value Range   ABO/RH(D) A POS    WET PREP, GENITAL     Status: Abnormal   Collection Time   05/28/12 10:01 PM      Component Value Range   Yeast Wet Prep HPF POC NONE SEEN  NONE SEEN   Trich, Wet Prep NONE SEEN  NONE SEEN   Clue Cells Wet Prep HPF POC FEW (*) NONE SEEN   WBC, Wet Prep HPF POC MODERATE (*) NONE SEEN   US Ob Comp Less 14 Wks  05/28/2012  *RADIOLOGY REPORT*  Clinical Data: 36 year old G3 P0 AB2, LMP 04/20/2012 (5 weeks 3 days), quantitative beta HCG 2706, presenting with right lower quadrant abdominal pain and right-sided pelvic pain.  OBSTETRIC <14 WK [place] AND TRANSVAGINAL OB US  Technique:  Both transabdominal and transvaginal ultrasound examinations were performed for complete evaluation of the gestation as well as the maternal  uterus, adnexal regions, and pelvic cul-de-sac.  Transvaginal technique was performed to assess early pregnancy.  Comparison:  None.  Intrauterine gestational sac:  Single, normal in shape. Yolk sac:  Visualized. Embryo: Not visualized. Cardiac Activity: Not visualized. Heart Rate: Not applicable.  MSD: 5.1 mm  5 w 0 d [place] EDC: 01/28/2013  Maternal uterus/adnexae: Normal decidual reaction.  No evidence of subchorionic hemorrhage. Right ovary normal in size and appearance, containing small follicular cysts, measuring approximately 4.1 x 2.6 x 2.4 cm.  Left ovary normal in size and appearance, containing small follicular cysts, measuring approximately 3.7 x 2.2 x 2.3 cm.  No adnexal masses or free pelvic fluid.  IMPRESSION:  1.  Single intrauterine gestational sac containing a yolk sac.  No visible embryo at this time due to early gestational age. Estimated gestational age by mean sac diameter is 5 weeks 0 days. 2.  No evidence of subchorionic hemorrhage. 3.  Normal-appearing ovaries.  No adnexal masses or free pelvic fluid.   Original Report Authenticated By: Hulan Saas, M.D.    US Ob Transvaginal  05/28/2012  *RADIOLOGY REPORT*  Clinical Data: 36 year old G3 P0 AB2, LMP 04/20/2012 (5 weeks 3 days), quantitative beta HCG 2706, presenting with right lower quadrant abdominal pain and right-sided pelvic pain.  OBSTETRIC <14 WK [place] AND TRANSVAGINAL OB US  Technique:  Both transabdominal and transvaginal ultrasound examinations were performed for complete evaluation of the gestation as well as the maternal uterus, adnexal regions, and pelvic cul-de-sac.  Transvaginal technique was performed to assess early pregnancy.  Comparison:  None.  Intrauterine gestational sac:  Single, normal in shape. Yolk sac: Visualized. Embryo: Not visualized. Cardiac Activity: Not visualized. Heart Rate: Not applicable.  MSD: 5.1 mm  5 w 0 d [place] EDC: 01/28/2013  Maternal uterus/adnexae: Normal decidual reaction.  No evidence of  subchorionic hemorrhage. Right ovary normal in size and appearance, containing small follicular cysts, measuring approximately 4.1 x 2.6 x 2.4 cm.  Left ovary normal in size and appearance, containing small follicular cysts, measuring approximately 3.7 x 2.2 x 2.3 cm.  No adnexal masses or free pelvic fluid.  IMPRESSION:  1.  Single intrauterine gestational sac containing a yolk sac.  No visible embryo at this time due to early gestational age. Estimated gestational age by mean sac diameter is 5 weeks 0 days. 2.  No evidence of subchorionic hemorrhage. 3.  Normal-appearing ovaries.  No adnexal masses or free pelvic fluid.   Original Report Authenticated By: Hulan Saas, M.D.    Assessment: 36 y.o. female @ [redacted]w[redacted]d with abdominal pain.    CLC right   IUGS with YS  Plan:  Start prenatal care   Tylenol prn for discomfort  I have reviewed this patient's vital signs, nurses notes, appropriate labs and imaging. I discussed results with the patient and she voices understanding   Medication List     As of 06/01/2012  5:48 PM    CONTINUE taking these medications         acetaminophen 500 MG tablet   Commonly known as: TYLENOL      amoxicillin 500 MG capsule   Commonly known as: AMOXIL      levothyroxine 175 MCG tablet   Commonly known as: SYNTHROID, LEVOTHROID      prenatal multivitamin Tabs      progesterone 200 MG capsule   Commonly known as: PROMETRIUM              MAU Course  Procedures   Cordney Barstow, RN, FNP, BC 05/28/2012, 10:52 PM

## 2012-05-28 NOTE — MAU Note (Signed)
Pt G3 P0 at 5.3wks having RLQ pain that started today.  Denies bleeding.

## 2012-06-27 ENCOUNTER — Inpatient Hospital Stay (HOSPITAL_COMMUNITY)
Admission: AD | Admit: 2012-06-27 | Discharge: 2012-06-27 | Disposition: A | Discharge status: Home / Self Care | Payer: BC Managed Care – PPO | Source: Ambulatory Visit | Attending: Obstetrics & Gynecology | Admitting: Obstetrics & Gynecology

## 2012-06-27 ENCOUNTER — Encounter (HOSPITAL_COMMUNITY): Payer: Self-pay | Admitting: Family

## 2012-06-27 ENCOUNTER — Inpatient Hospital Stay (HOSPITAL_COMMUNITY): Payer: BC Managed Care – PPO

## 2012-06-27 DIAGNOSIS — O021 Missed abortion: Secondary | ICD-10-CM | POA: Insufficient documentation

## 2012-06-27 DIAGNOSIS — O368390 Maternal care for abnormalities of the fetal heart rate or rhythm, unspecified trimester, not applicable or unspecified: Secondary | ICD-10-CM

## 2012-06-27 DIAGNOSIS — O358XX0 Maternal care for other (suspected) fetal abnormality and damage, not applicable or unspecified: Secondary | ICD-10-CM

## 2012-06-27 LAB — HCG, QUANTITATIVE, PREGNANCY: hCG, Beta Chain, Quant, S: 33437 m[IU]/mL — ABNORMAL HIGH (ref <5)

## 2012-06-27 LAB — CBC
Platelets: 279 10*3/uL (ref 150–400)
RDW: 13.5 % (ref 11.5–15.5)
WBC: 13.4 10*3/uL — ABNORMAL HIGH (ref 4.0–10.5)

## 2012-06-27 MED ORDER — IBUPROFEN 800 MG PO TABS: 800.0000 mg | ORAL_TABLET | Freq: Once | ORAL | Status: DC

## 2012-06-27 MED ORDER — MISOPROSTOL 200 MCG PO TABS: 800.0000 ug | ORAL_TABLET | Freq: Four times a day (QID) | ORAL | Status: DC

## 2012-06-27 MED ORDER — IBUPROFEN 800 MG PO TABS: 800.0000 mg | ORAL_TABLET | Freq: Three times a day (TID) | ORAL | Status: DC | PRN

## 2012-06-27 MED ORDER — ONDANSETRON 8 MG PO TBDP: 8.0000 mg | ORAL_TABLET | Freq: Three times a day (TID) | ORAL | Status: DC | PRN

## 2012-06-27 MED ORDER — HYDROCODONE-ACETAMINOPHEN 5-325 MG PO TABS: 1.0000 | ORAL_TABLET | Freq: Four times a day (QID) | ORAL | Status: DC | PRN

## 2012-06-27 MED ORDER — MISOPROSTOL 200 MCG PO TABS: ORAL_TABLET | ORAL | Status: DC

## 2012-06-27 NOTE — MAU Provider Note (Signed)
History     CSN: [id number]  Arrival date and time: 06/27/12 1624   First Provider Initiated Contact with Patient 06/27/12 1753      No chief complaint on file.  HPI 36 y.o. G3P0020 at [redacted]w[redacted]d by LMP. Undergoing IUI with infertility doctor, was seen here on 11/3 with IUGS and yolk sac on u/s. Saw her doctor for u/s on 11/15, 6 week size IUP with + FHR seen at that time. Second u/s with her doctor on 11/26 showed ? Demise - pt states that her doctor could not see clearly on scan, but told her that she "was probably going to have a miscarriage" and that they were not able to see FHR at that time. Was told to follow up in 3 weeks, but did not want to wait that long. Tried to get in with an OB, but could not get appointment. No bleeding, mild cramping which has been ongoing since onset of pregnancy. H/O SAB x 2.    Past Medical History  Diagnosis Date  . PCOS (polycystic ovarian syndrome)   . Diabetes mellitus     insulin resistance w/ PCOS  . Anxiety   . Depression     doing good, no meds in 2 yrs.  . Endometriosis   . Hypothyroid   . Chronic kidney disease     kidney stones    Past Surgical History  Procedure Date  . Cholecystectomy   . Total thyroidectomy   . Ovarian cyst removal     Family History  Problem Relation Age of Onset  . Other Neg Hx   . Diabetes Mother   . Hypertension Father     History  Substance Use Topics  . Smoking status: Former Smoker -- 0.2 packs/day for 15 years    Types: Cigarettes    Quit date: 05/16/2012  . Smokeless tobacco: Never Used  . Alcohol Use: Yes     Comment: not now    Allergies:  Allergies  Allergen Reactions  . Demerol Anaphylaxis  . Hydrocodone Nausea Only  . Percocet (Oxycodone-Acetaminophen) Nausea Only  . Tramadol Nausea Only    Prescriptions prior to admission  Medication Sig Dispense Refill  . acetaminophen (TYLENOL) 500 MG tablet Take 500 mg by mouth daily as needed. cramps      . calcium carbonate (TUMS - DOSED  IN MG ELEMENTAL CALCIUM) 500 MG chewable tablet Chew 2 tablets by mouth at bedtime as needed.      Marland Kitchen levothyroxine (SYNTHROID, LEVOTHROID) 175 MCG tablet Take 175 mcg by mouth daily. Pt takes 6 days a week, everyday but Tuesdays.      . Prenatal Vit-Fe Fumarate-FA (PRENATAL MULTIVITAMIN) TABS Take 1 tablet by mouth at bedtime.      . [DISCONTINUED] ibuprofen (ADVIL,MOTRIN) 800 MG tablet Take 800 mg by mouth once.      . [DISCONTINUED] progesterone (PROMETRIUM) 200 MG capsule Take 400 mg by mouth at bedtime. Until positive pregnancy, or until period starts. Took Oct 10,2013 and still taking        Review of Systems  Constitutional: Negative.   Respiratory: Negative.   Cardiovascular: Negative.   Gastrointestinal: Positive for abdominal pain (mild cramping). Negative for nausea, vomiting, diarrhea and constipation.  Genitourinary: Negative for dysuria, urgency, frequency, hematuria and flank pain.       Negative for vaginal bleeding, vaginal discharge  Musculoskeletal: Negative.   Neurological: Negative.   Psychiatric/Behavioral: Negative.    Physical Exam   Blood pressure 133/75, pulse 102, temperature 98.6  F (37 C), temperature source Oral, resp. rate 20, height 5\' 7"  (1.702 m), last menstrual period 04/20/2012.  Physical Exam  Nursing note and vitals reviewed. Constitutional: She is oriented to person, place, and time. She appears well-developed and well-nourished. No distress.  Cardiovascular: Normal rate.   Respiratory: Effort normal.  GI: Soft.  Musculoskeletal: Normal range of motion.  Neurological: She is alert and oriented to person, place, and time.  Skin: Skin is warm and dry.  Psychiatric: She has a normal mood and affect.    MAU Course  Procedures  Results for orders placed during the hospital encounter of 06/27/12 (from the past 24 hour(s))  CBC     Status: Abnormal   Collection Time   06/27/12  7:19 PM      Component Value Range   WBC 13.4 (*) 4.0 - 10.5 K/uL    RBC 4.41  3.87 - 5.11 MIL/uL   Hemoglobin 13.7  12.0 - 15.0 g/dL   HCT 16.1  09.6 - 04.5 %   MCV 86.6  78.0 - 100.0 fL   MCH 31.1  26.0 - 34.0 pg   MCHC 35.9  30.0 - 36.0 g/dL   RDW 40.9  81.1 - 91.4 %   Platelets 279  150 - 400 K/uL  HCG, QUANTITATIVE, PREGNANCY     Status: Abnormal   Collection Time   06/27/12  7:19 PM      Component Value Range   hCG, Beta Chain, Quant, S 33437 (*) <5 mIU/mL   U/S: 7.3 week size IUGS, no yolk sac or fetal pole now seen, c/w failed IUP  Pt and partner state they want to complete the miscarriage quickly, but hopefully without surgical intervention, request cytotec.   Assessment and Plan   1. Absence of fetal heart activity   2. Missed ab   Precautions rev'd - return with excessive bleeding, excessive pain or if no bleeding within 3 days after cytotec. Otherwise, f/u in 2 weeks in GYN clinic.     Medication List     As of 06/27/2012  9:32 PM    START taking these medications         HYDROcodone-acetaminophen 5-325 MG per tablet   Commonly known as: NORCO/VICODIN   Take 1 tablet by mouth every 6 (six) hours as needed for pain.      misoprostol 200 MCG tablet   Commonly known as: CYTOTEC   4 tabs vaginally x 1      ondansetron 8 MG disintegrating tablet   Commonly known as: ZOFRAN-ODT   Take 1 tablet (8 mg total) by mouth every 8 (eight) hours as needed for nausea.      CHANGE how you take these medications         ibuprofen 800 MG tablet   Commonly known as: ADVIL,MOTRIN   Take 1 tablet (800 mg total) by mouth every 8 (eight) hours as needed for pain.   What changed: - how often to take the med - reasons to take the med      CONTINUE taking these medications         acetaminophen 500 MG tablet   Commonly known as: TYLENOL      calcium carbonate 500 MG chewable tablet   Commonly known as: TUMS - dosed in mg elemental calcium      levothyroxine 175 MCG tablet   Commonly known as: SYNTHROID, LEVOTHROID      prenatal  multivitamin Tabs      STOP  taking these medications         progesterone 200 MG capsule   Commonly known as: PROMETRIUM          Where to get your medications    These are the prescriptions that you need to pick up. We sent them to a specific pharmacy, so you will need to go there to get them.   [id number] - [place], [place] - [address]    [address] [place] [address]    Phone: [phone]        ibuprofen 800 MG tablet   misoprostol 200 MCG tablet   ondansetron 8 MG disintegrating tablet         You may get these medications from any pharmacy.         HYDROcodone-acetaminophen 5-325 MG per tablet            Follow-up Information    Follow up with [facility]. In 2 weeks. (call if no bleeding by Friday)    Contact information:   [address] [place] [address] [phone]           Kawanda Drumheller 06/27/2012, 7:20 PM

## 2012-06-27 NOTE — MAU Note (Signed)
Nov 15th, Dr Elesa Hacker- (fertility doc) saw FH on [place]; on 11/26 went back and was unable to see heart beat or fetal pole, uterus is tipped and had a very hard time to visualize anything..  Was to follow up in 3 wks. Planned to go to  [facility]; called them and they told her to come here, because they couldn't get in for a couple wks.  Pt is very upset; if she has lost the pregnancy; she doesn't want to be carrying a dead baby inside.  Want to know what  Is going on.  Has had no spotting, minimal cramping.

## 2012-06-27 NOTE — MAU Note (Signed)
Pt reports was by Dr. Elesa Hacker at [facility] last Tuesday and [place] was unable to identify fetal pole. Called [facility] today and they could not see her and was told to come to MAU for follow-up.  Denies vaginal bleeding; mild cramping consistent with entire pregnancy.

## 2012-06-27 NOTE — MAU Provider Note (Signed)
Attestation of Attending Supervision of Advanced Practitioner (CNM/NP): Evaluation and management procedures were performed by the Advanced Practitioner under my supervision and collaboration.  I have reviewed the Advanced Practitioner's note and chart, and I agree with the management and plan.  HARRAWAY-SMITH, Doreen Garretson 10:23 PM     

## 2012-11-27 IMAGING — CT CT ABD-PELV W/ CM
1 of 2 series · 15 of 32 positions shown, 19 images · IV contrast (APPLIED)
Comparison: None.

CLINICAL DATA: Right lower quadrant abdominal pain and nausea.

CT ABDOMEN AND PELVIS WITH CONTRAST
TECHNIQUE: Multidetector CT imaging of the abdomen and pelvis was
performed following the standard protocol during bolus
administration of intravenous contrast.
Contrast: 100mL OMNIPAQUE IOHEXOL 300 MG/ML IV SOLN

[Series 2: abd/pelv with 5.0 b31f st · axial · 0.85mm/px · z∈[-672,-252]mm · 15 of 93 slices shown, 19 images]
[im 5/93  soft-tissue]
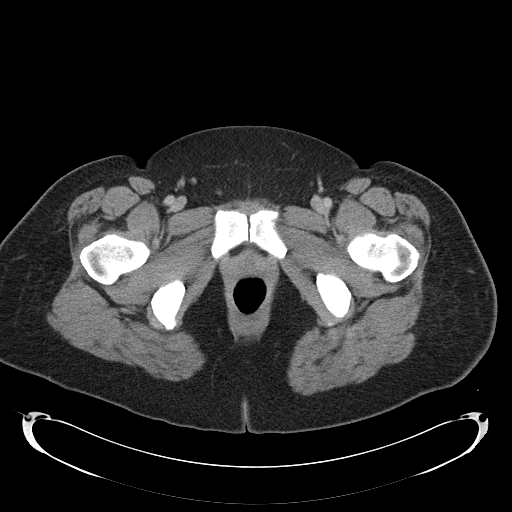
[im 5/93  bone]
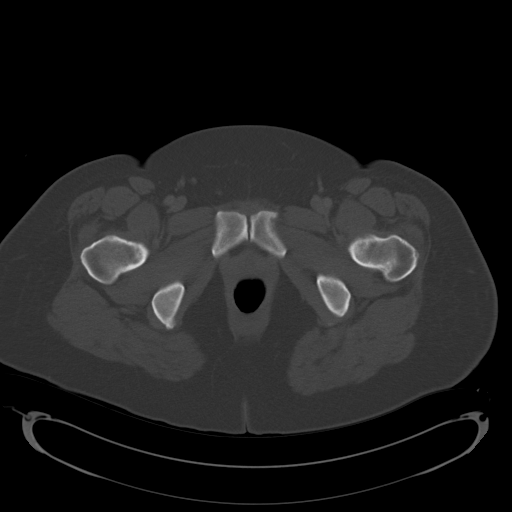
[im 13/93  soft-tissue]
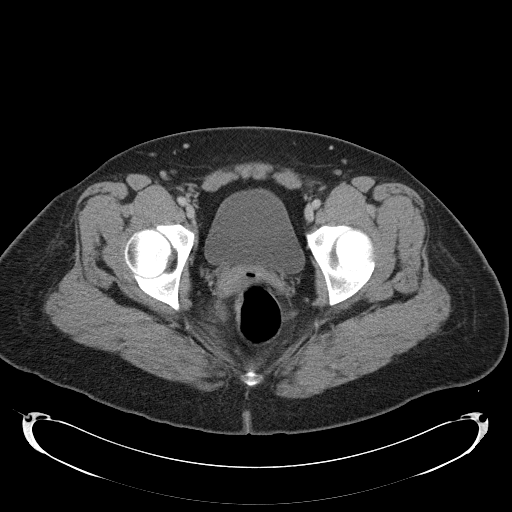
[im 21/93  soft-tissue]
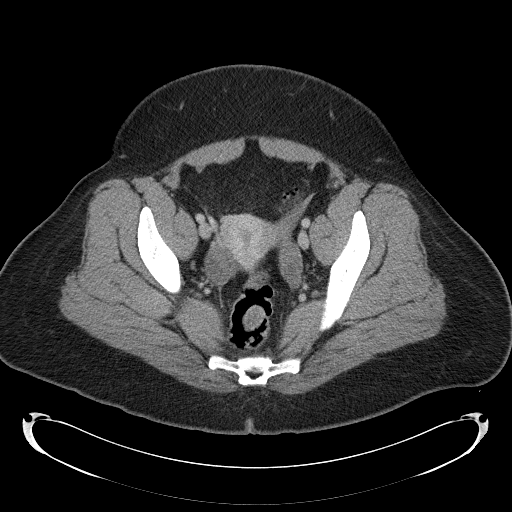
[im 25/93  soft-tissue]
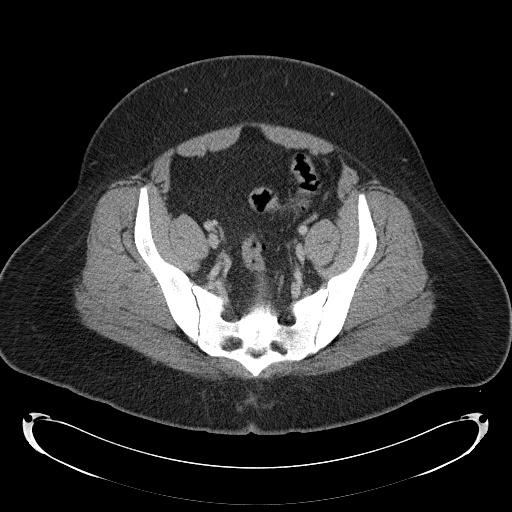
[im 33/93  soft-tissue]
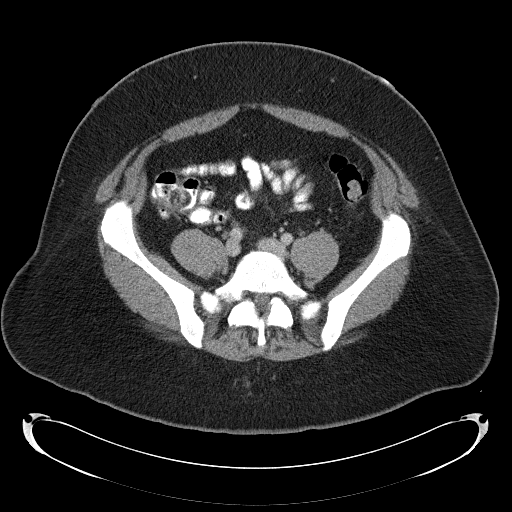
[im 41/93  soft-tissue]
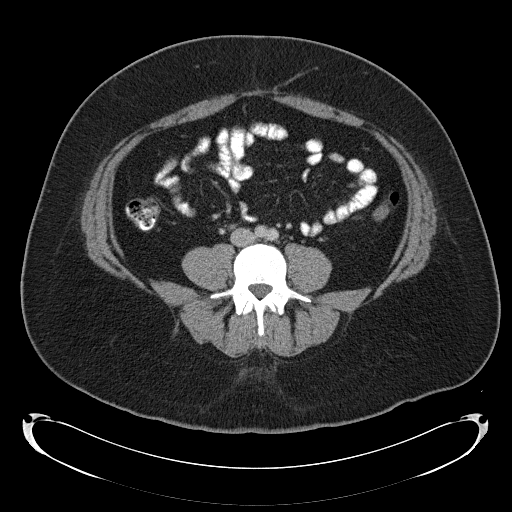
[im 49/93  soft-tissue]
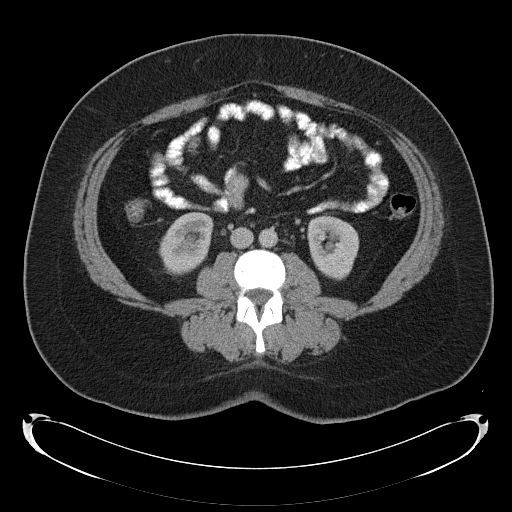
[im 53/93  soft-tissue]
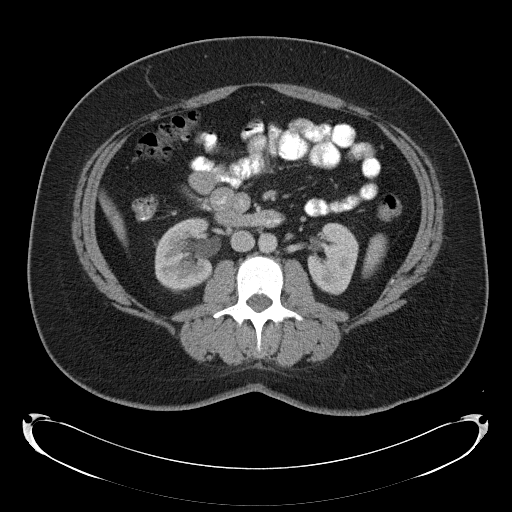
[im 61/93  soft-tissue]
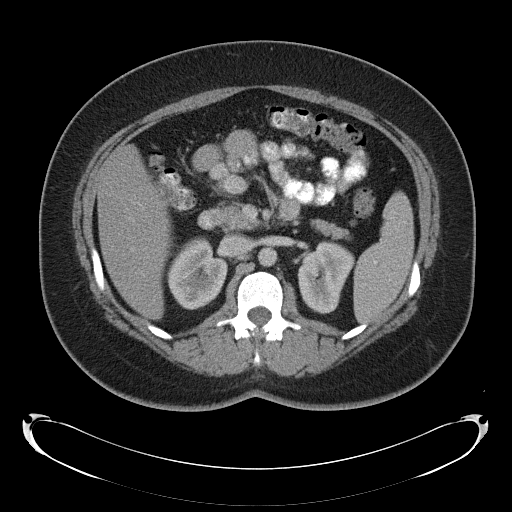
[im 61/93  bone]
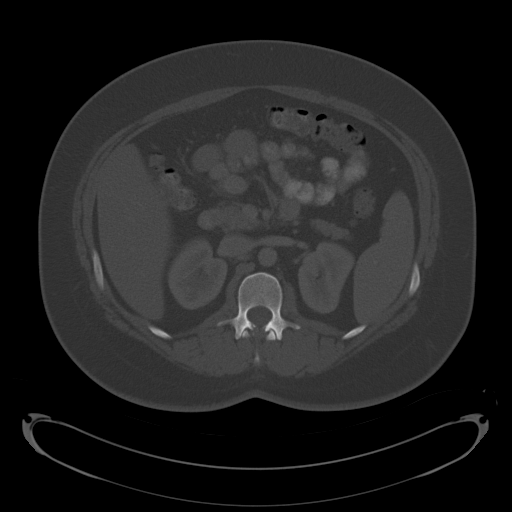
[im 69/93  soft-tissue]
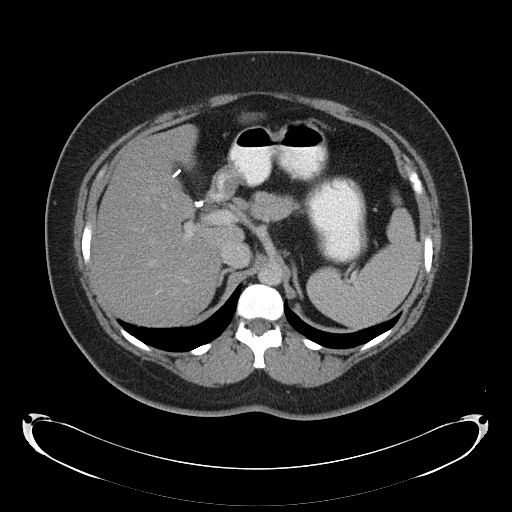
[im 73/93  soft-tissue]
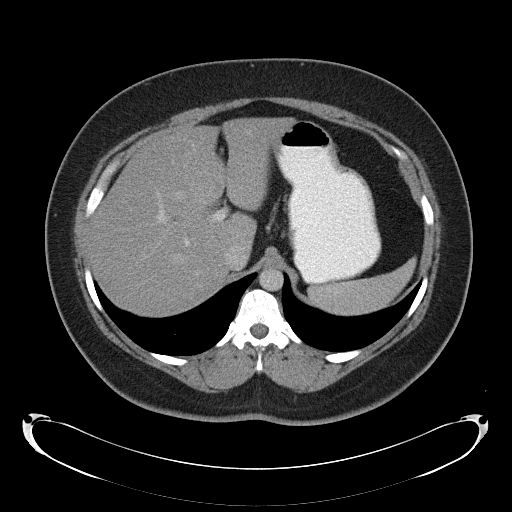
[im 77/93  lung]
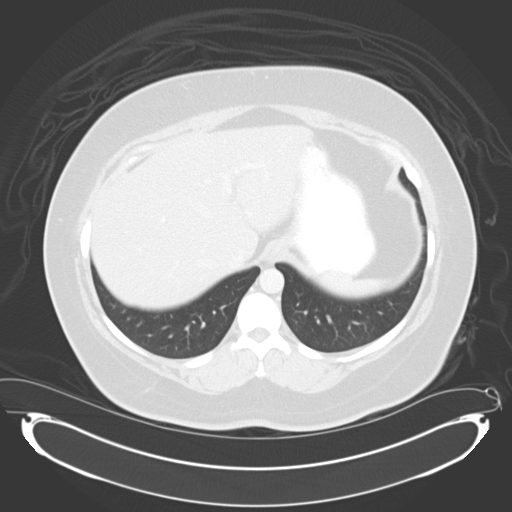
[im 81/93  soft-tissue]
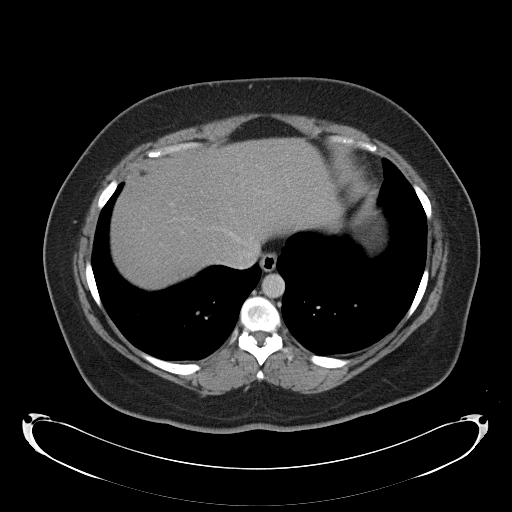
[im 81/93  lung]
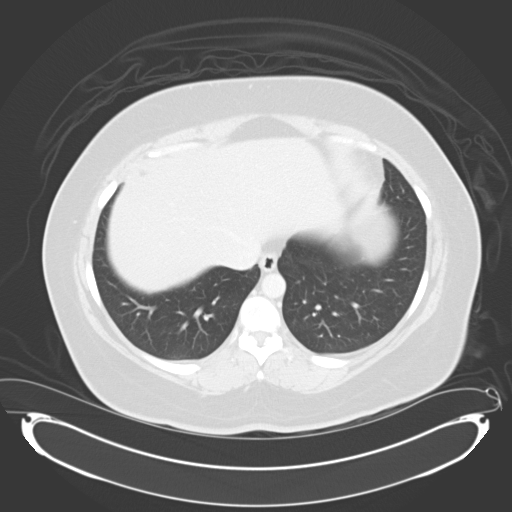
[im 85/93  lung]
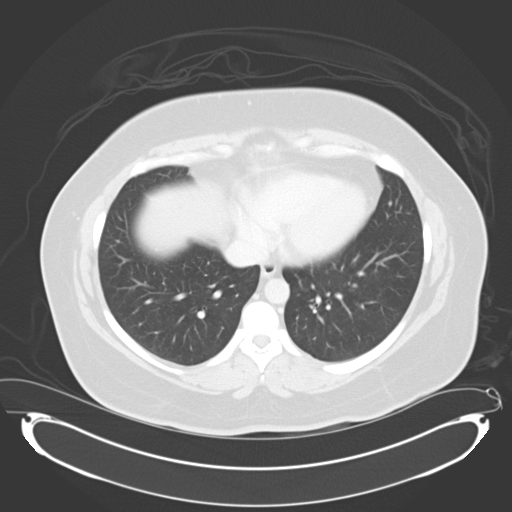
[im 89/93  soft-tissue]
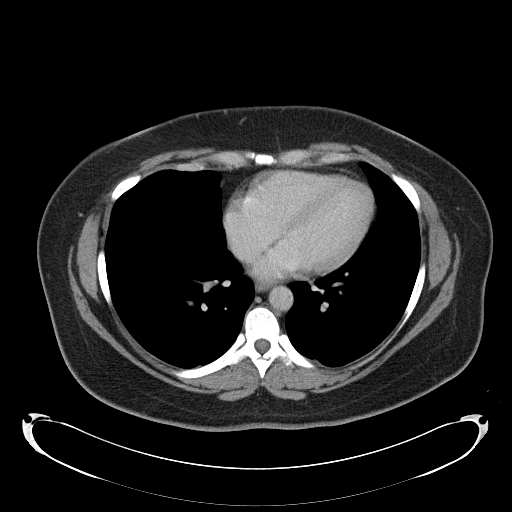
[im 89/93  lung]
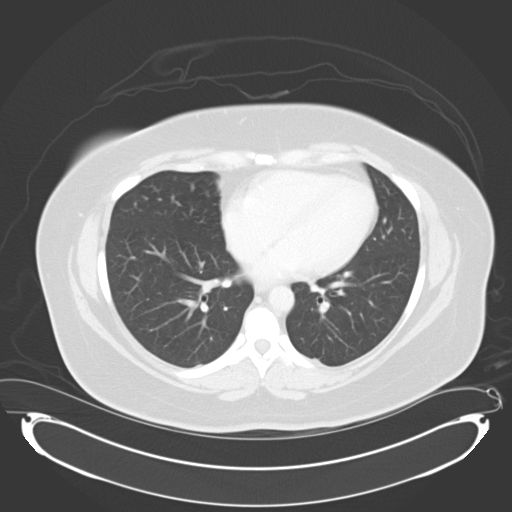

[15 of 32 positions shown; findings below may reference images not displayed]

FINDINGS: The lung bases are clear.  Low attenuation change in the
liver consistent with diffuse fatty infiltration.  Tiny focal low
attenuation lesion in the anterior segment right lobe of the liver
inferiorly measuring about 7 mm.  Nodular enhancement pattern
suggests a small cavernous hemangioma.  Surgical absence of the
gallbladder.  The spleen size is normal.  The pancreas, adrenal
glands, abdominal aorta, and retroperitoneal lymph nodes are
unremarkable.  Suggestion of punctate nonobstructing calcifications
in the kidneys.  No solid mass or hydronephrosis.  No gastric wall
thickening or distension.  No small bowel dilatation.  No free air
or free fluid in the abdomen.  Stool filled decompressed colon.

Pelvis:  Diverticula in the sigmoid colon.  No inflammatory change
suggested.  Uterus and adnexal structures are not enlarged.  No
free or loculated pelvic fluid collection.  No bladder wall
thickening.  The appendix is normal.  Normal alignment of the
lumbar vertebrae.
IMPRESSION: No acute process demonstrated in the abdomen or pelvis.  Fatty
infiltration of the liver with tiny cavernous hemangioma.  Punctate
nonobstructing renal stones bilaterally.

## 2012-11-27 IMAGING — US US PELVIS COMPLETE
1 series · 14 of 25 positions shown · non-contrast
Comparison: OB ultrasound 04/14/2004

CLINICAL DATA: Right suprapubic pain.  Right lower quadrant pain.
Bleeding for 2 weeks.  Gravida 2, para 0.  LMP 07/10/2011.

TRANSABDOMINAL AND TRANSVAGINAL ULTRASOUND OF PELVIS
TECHNIQUE: Both transabdominal and transvaginal ultrasound
examinations of the pelvis were performed. Transabdominal technique
was performed for global imaging of the pelvis including uterus,
ovaries, adnexal regions, and pelvic cul-de-sac.

[Series 1: us pelvis complete · 0.28mm/px · 14 of 60 slices shown]
[im 1/60]
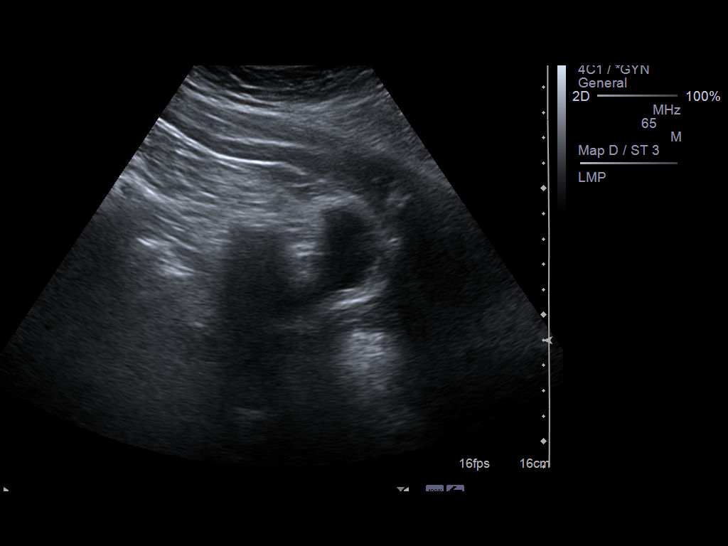
[im 5/60]
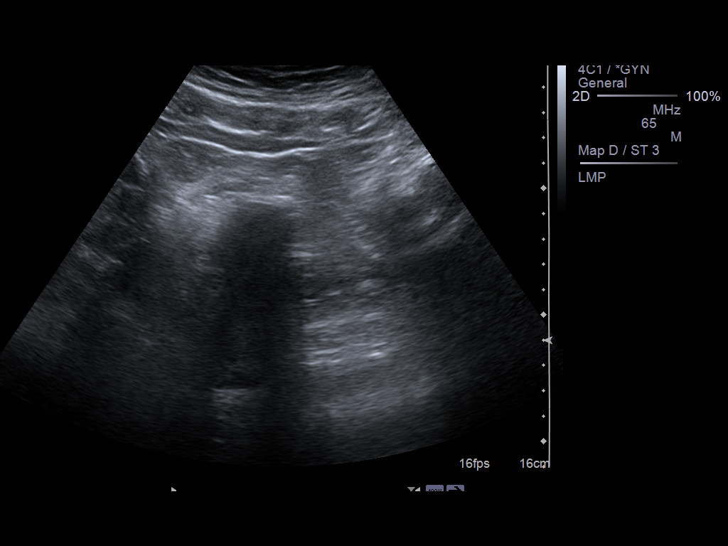
[im 10/60]
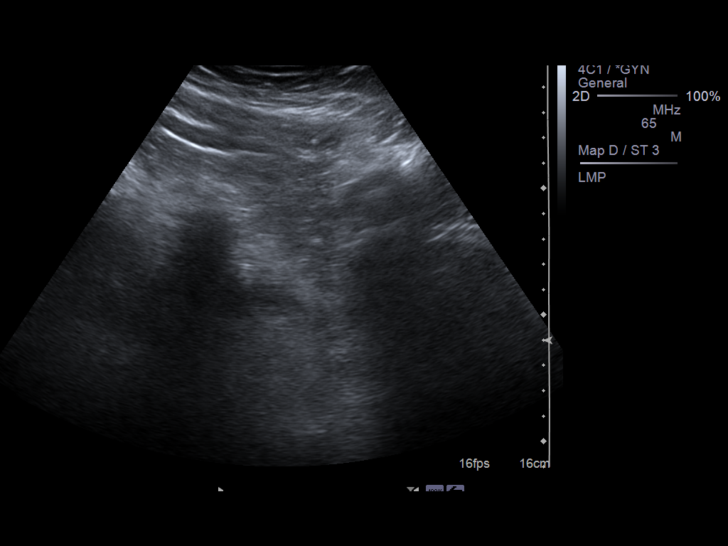
[im 15/60]
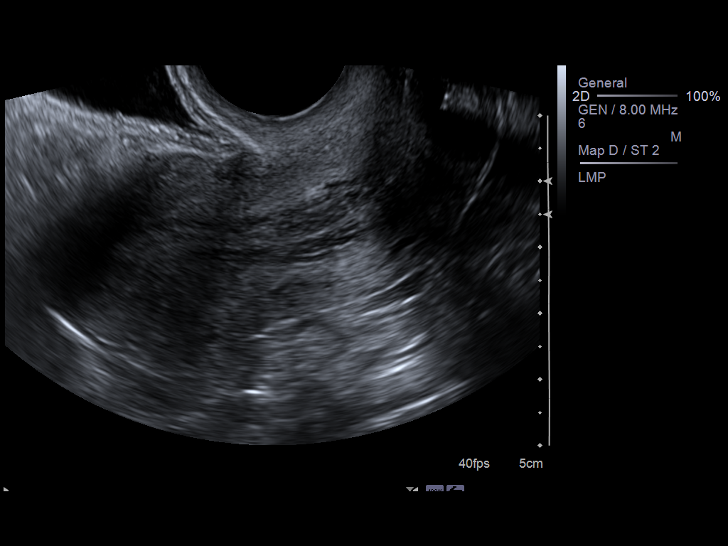
[im 20/60]
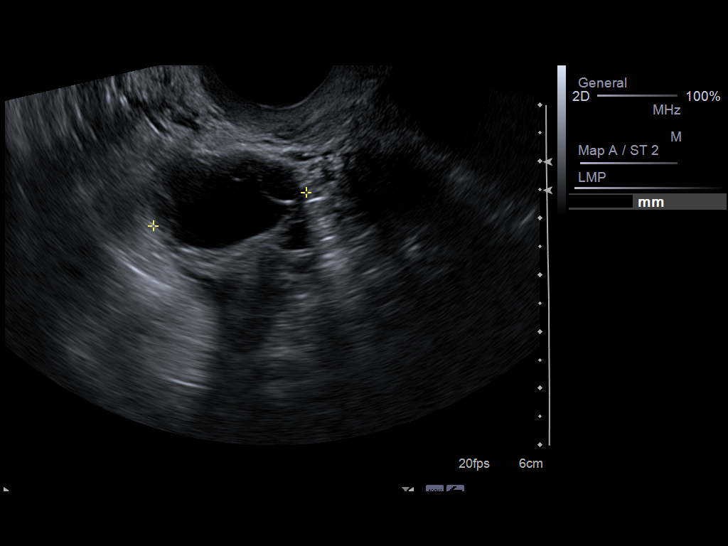
[im 23/60]
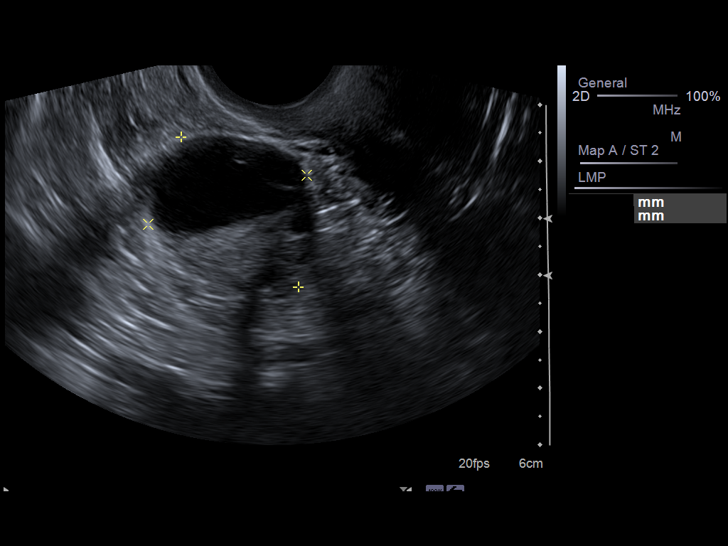
[im 28/60]
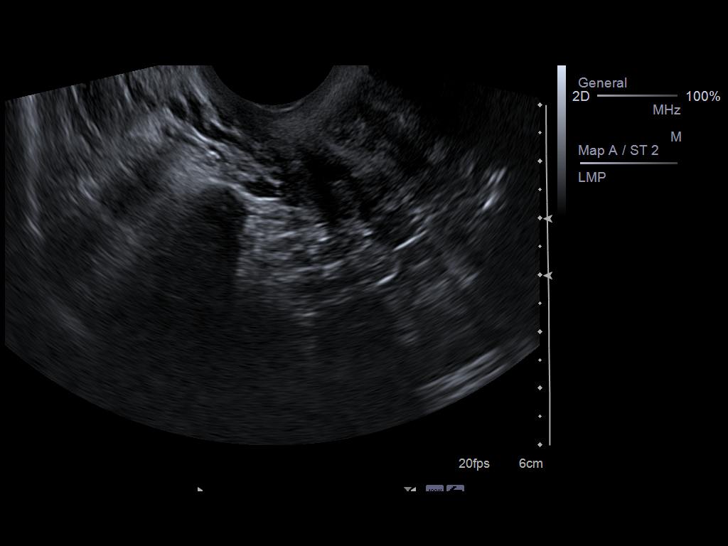
[im 32/60]
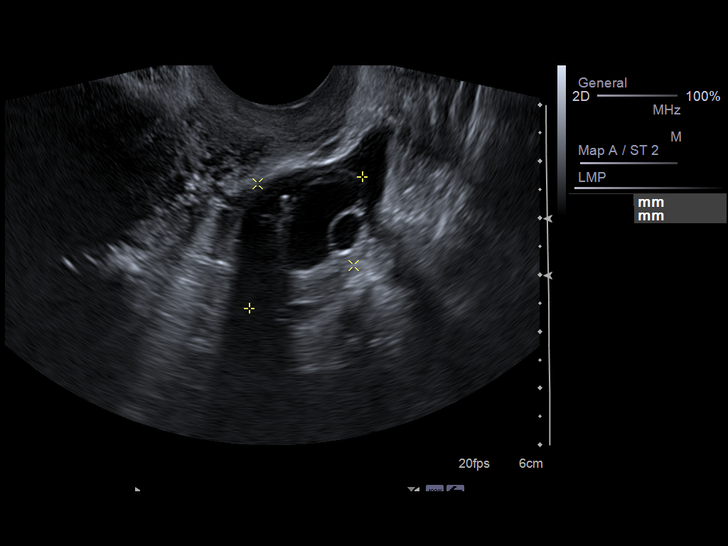
[im 37/60]
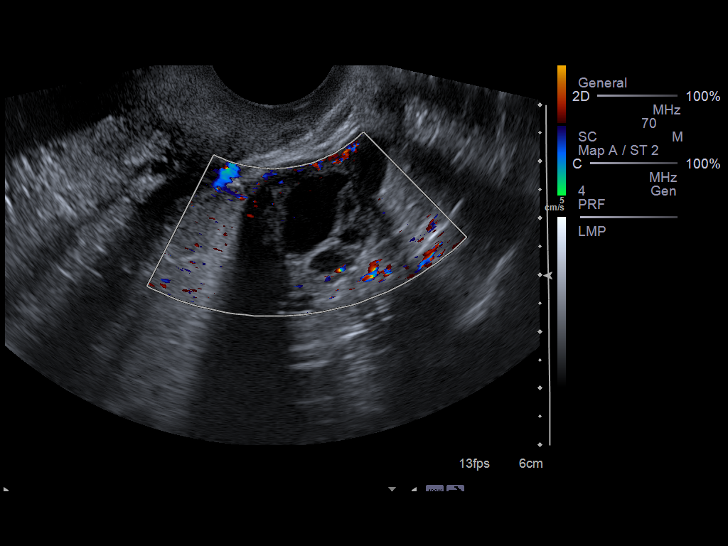
[im 40/60]
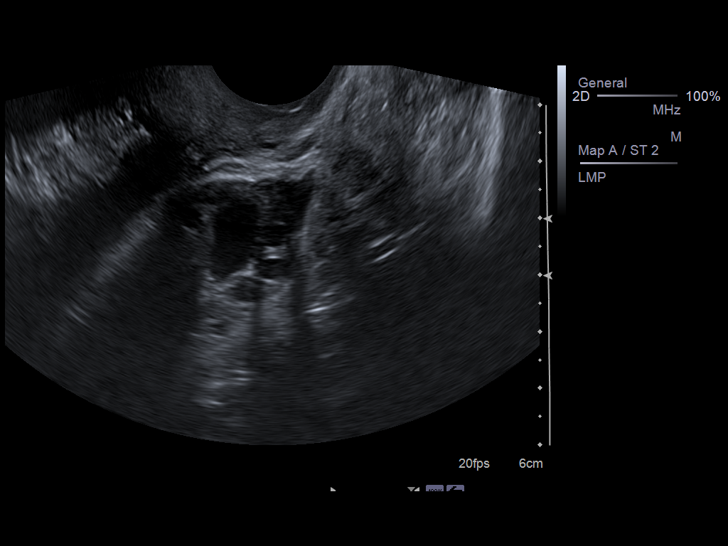
[im 45/60]
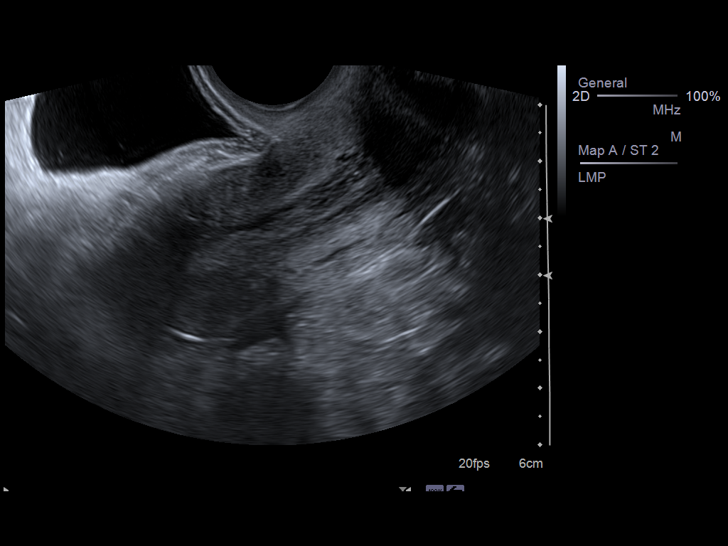
[im 50/60]
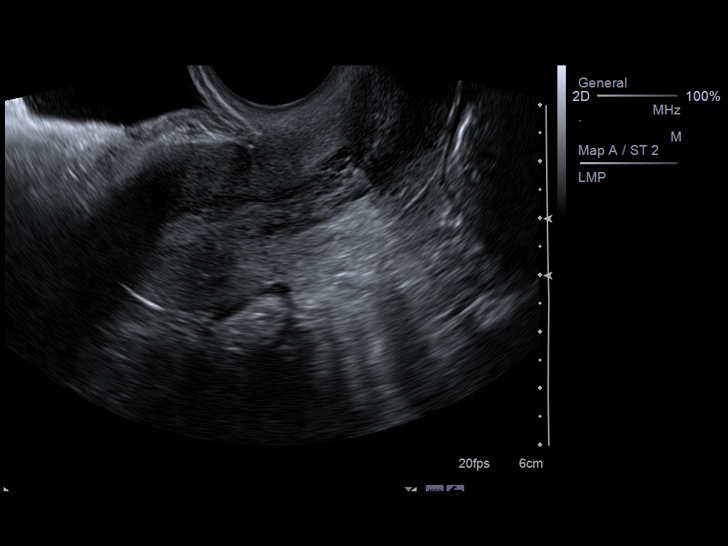
[im 55/60]
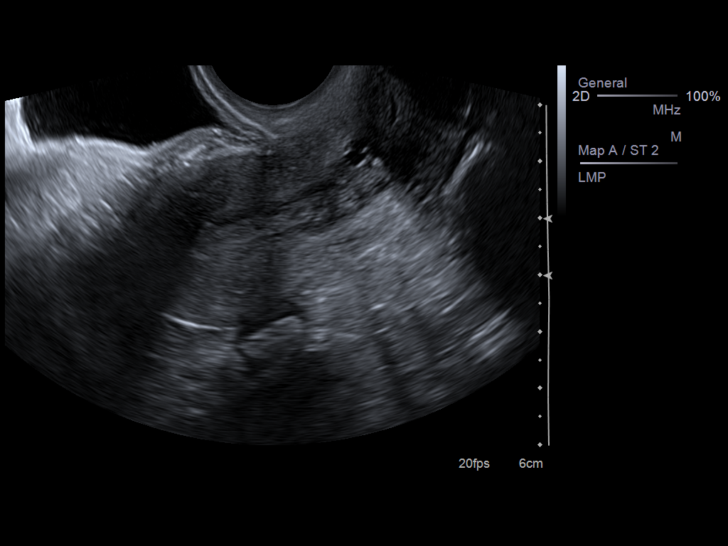
[im 60/60]
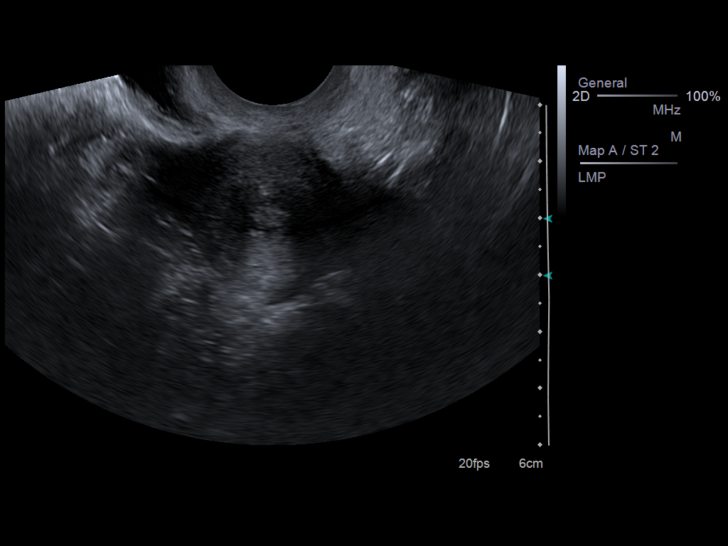

[14 of 25 positions shown; findings below may reference images not displayed]

It was necessary to proceed with endovaginal exam following the
transabdominal exam to visualize the uterus and ovaries.
FINDINGS: Uterus: The uterus is 6.6 x 2.9 x 3.5 cm.  No uterine mass
identified.

Endometrium: The endometrium is 6.4 mm.  No focal lesion
identified.

Right ovary:  The right ovary is 3.4 x 2.9 x 2.8 cm.  A right
ovarian follicle is 2.7 cm.

Left ovary: Left ovary is 3.1 x 2.2 x 3.2 cm.  Follicle is 1.5 cm.

Other findings: There is a trace of free pelvic fluid.
IMPRESSION: 1.Normal pelvic ultrasound.
2.  No evidence for adnexal mass or significant free pelvic fluid.

## 2012-12-30 ENCOUNTER — Inpatient Hospital Stay (HOSPITAL_COMMUNITY)
Admission: AD | Admit: 2012-12-30 | Discharge: 2012-12-31 | Disposition: A | Discharge status: Home / Self Care | Payer: PRIVATE HEALTH INSURANCE | Source: Ambulatory Visit | Attending: Obstetrics and Gynecology | Admitting: Obstetrics and Gynecology

## 2012-12-30 ENCOUNTER — Encounter (HOSPITAL_COMMUNITY): Payer: Self-pay | Admitting: *Deleted

## 2012-12-30 DIAGNOSIS — N949 Unspecified condition associated with female genital organs and menstrual cycle: Secondary | ICD-10-CM

## 2012-12-30 DIAGNOSIS — N938 Other specified abnormal uterine and vaginal bleeding: Secondary | ICD-10-CM | POA: Insufficient documentation

## 2012-12-30 HISTORY — DX: Irritable bowel syndrome, unspecified: K58.9

## 2012-12-30 LAB — URINALYSIS, ROUTINE W REFLEX MICROSCOPIC
Leukocytes, UA: NEGATIVE
Protein, ur: NEGATIVE mg/dL
Urobilinogen, UA: 0.2 mg/dL (ref 0.0–1.0)

## 2012-12-30 LAB — CBC
MCHC: 35.8 g/dL (ref 30.0–36.0)
RDW: 13.3 % (ref 11.5–15.5)

## 2012-12-30 LAB — URINE MICROSCOPIC-ADD ON

## 2012-12-30 LAB — POCT PREGNANCY, URINE: Preg Test, Ur: NEGATIVE

## 2012-12-30 MED ORDER — TRANEXAMIC ACID 650 MG PO TABS: ORAL_TABLET | ORAL | Status: DC

## 2012-12-30 NOTE — MAU Note (Signed)
Had D&C 12/4 for miscarriage at [facility]. Have had on and off bleeding since then. Since Tues bleeding has been heavier. Passing clots size of my hand.U/S 3 wks ago showed 3 cm cyst. Put me on BCP to help but seems like bleeding has gotten worse.

## 2012-12-30 NOTE — MAU Provider Note (Signed)
History     CSN: [id number]  Arrival date and time: 12/30/12 2146   First Provider Initiated Contact with Patient 12/30/12 2230      Chief Complaint  Patient presents with  . Vaginal Bleeding   HPI  Pt is a G3P0030 here post D&C on 06/29/13 with report of vaginal bleeding that has not stopped since.  Bleeding picked up in past three days and now starting to pass clots.  Reports passing multiple walnut sized clots.  Changing pad "multiple" times a day, unable to give a number.  +cramping, mild to moderate.  On week three of OCPs.    Past Medical History  Diagnosis Date  . PCOS (polycystic ovarian syndrome)   . Diabetes mellitus     insulin resistance w/ PCOS  . Anxiety   . Depression     doing good, no meds in 2 yrs.  . Endometriosis   . Hypothyroid   . Chronic kidney disease     kidney stones  . IBS (irritable bowel syndrome)     Past Surgical History  Procedure Laterality Date  . Cholecystectomy    . Total thyroidectomy    . Ovarian cyst removal      Family History  Problem Relation Age of Onset  . Other Neg Hx   . Diabetes Mother   . Hypertension Father     History  Substance Use Topics  . Smoking status: Former Smoker -- 0.25 packs/day for 15 years    Types: Cigarettes    Quit date: 05/16/2012  . Smokeless tobacco: Never Used  . Alcohol Use: No    Allergies:  Allergies  Allergen Reactions  . Demerol Anaphylaxis  . Hydrocodone Nausea Only  . Percocet (Oxycodone-Acetaminophen) Nausea Only  . Tramadol Nausea Only    Prescriptions prior to admission  Medication Sig Dispense Refill  . acetaminophen (TYLENOL) 500 MG tablet Take 1,000 mg by mouth every 6 (six) hours as needed for pain.      . diphenhydrAMINE (BENADRYL) 50 MG capsule Take 50 mg by mouth every 6 (six) hours as needed for itching.      Marland Kitchen ibuprofen (ADVIL,MOTRIN) 200 MG tablet Take 800 mg by mouth every 6 (six) hours as needed for pain.      Marland Kitchen levothyroxine (SYNTHROID, LEVOTHROID) 175 MCG  tablet Take 175 mcg by mouth daily. Pt takes 6 days a week, everyday but Tuesdays.      . norethindrone-ethinyl estradiol (JUNEL FE,GILDESS FE,LOESTRIN FE) 1-20 MG-MCG tablet Take 1 tablet by mouth daily.      Marland Kitchen omeprazole (PRILOSEC) 20 MG capsule Take 20 mg by mouth daily.      Marland Kitchen acetaminophen (TYLENOL) 500 MG tablet Take 500 mg by mouth daily as needed. cramps      . calcium carbonate (TUMS - DOSED IN MG ELEMENTAL CALCIUM) 500 MG chewable tablet Chew 2 tablets by mouth at bedtime as needed.      Marland Kitchen HYDROcodone-acetaminophen (NORCO/VICODIN) 5-325 MG per tablet Take 1 tablet by mouth every 6 (six) hours as needed for pain.  20 tablet  0  . ibuprofen (ADVIL,MOTRIN) 800 MG tablet Take 1 tablet (800 mg total) by mouth every 8 (eight) hours as needed for pain.  30 tablet  1  . misoprostol (CYTOTEC) 200 MCG tablet 4 tabs vaginally x 1  4 tablet  0  . ondansetron (ZOFRAN ODT) 8 MG disintegrating tablet Take 1 tablet (8 mg total) by mouth every 8 (eight) hours as needed for nausea.  20  tablet  0  . Prenatal Vit-Fe Fumarate-FA (PRENATAL MULTIVITAMIN) TABS Take 1 tablet by mouth at bedtime.        Review of Systems  Gastrointestinal: Positive for abdominal pain (cramping). Negative for nausea and vomiting.  Genitourinary: Negative.        Vaginal bleeding  Neurological: Negative for headaches.  All other systems reviewed and are negative.   Physical Exam   Blood pressure 123/87, pulse 93, temperature 98.3 F (36.8 C), resp. rate 18, height 5\' 6"  (1.676 m), weight 107.049 kg (236 lb), last menstrual period 04/20/2012, SpO2 100.00%, unknown if currently breastfeeding.  Physical Exam  Constitutional: She is oriented to person, place, and time. She appears well-developed and well-nourished. No distress.  HENT:  Head: Normocephalic.  Neck: Normal range of motion. Neck supple.  Cardiovascular: Normal rate, regular rhythm and normal heart sounds.   Respiratory: Effort normal and breath sounds normal.  No respiratory distress.  GI: Soft. There is no tenderness.  Genitourinary: Right adnexum displays no mass, no tenderness and no fullness. Left adnexum displays no mass, no tenderness and no fullness. No bleeding around the vagina.  Difficult to assess uterus secondary to weight  Musculoskeletal: Normal range of motion.  Neurological: She is alert and oriented to person, place, and time.  Skin: Skin is warm and dry.    MAU Course  Procedures  Results for orders placed during the hospital encounter of 12/30/12 (from the past 24 hour(s))  URINALYSIS, ROUTINE W REFLEX MICROSCOPIC     Status: Abnormal   Collection Time    12/30/12 10:01 PM      Result Value Range   Color, Urine YELLOW  YELLOW   APPearance CLEAR  CLEAR   Specific Gravity, Urine 1.025  1.005 - 1.030   pH 5.5  5.0 - 8.0   Glucose, UA NEGATIVE  NEGATIVE mg/dL   Hgb urine dipstick LARGE (*) NEGATIVE   Bilirubin Urine NEGATIVE  NEGATIVE   Ketones, ur NEGATIVE  NEGATIVE mg/dL   Protein, ur NEGATIVE  NEGATIVE mg/dL   Urobilinogen, UA 0.2  0.0 - 1.0 mg/dL   Nitrite NEGATIVE  NEGATIVE   Leukocytes, UA NEGATIVE  NEGATIVE  URINE MICROSCOPIC-ADD ON     Status: None   Collection Time    12/30/12 10:01 PM      Result Value Range   Squamous Epithelial / LPF RARE  RARE   WBC, UA 0-2  <3 WBC/hpf   RBC / HPF 21-50  <3 RBC/hpf   Bacteria, UA RARE  RARE  POCT PREGNANCY, URINE     Status: None   Collection Time    12/30/12 10:14 PM      Result Value Range   Preg Test, Ur NEGATIVE  NEGATIVE  CBC     Status: Abnormal   Collection Time    12/30/12 10:45 PM      Result Value Range   WBC 11.9 (*) 4.0 - 10.5 K/uL   RBC 4.15  3.87 - 5.11 MIL/uL   Hemoglobin 12.6  12.0 - 15.0 g/dL   HCT 25.9 (*) 56.3 - 87.5 %   MCV 84.8  78.0 - 100.0 fL   MCH 30.4  26.0 - 34.0 pg   MCHC 35.8  30.0 - 36.0 g/dL   RDW 64.3  32.9 - 51.8 %   Platelets 272  150 - 400 K/uL   Consulted with Dr. Tenny Craw > reviewed HPI/medical history/exam/labs > give  Lysteda 650 mg ii TID x 5 days and follow-up  with Dr. Henderson Cloud in office  Assessment and Plan  DUB  Plan: DC to home RX Lysteda Follow-up with Dr. Henderson Cloud  [facility] 12/30/2012, 10:33 PM

## 2012-12-31 NOTE — Progress Notes (Signed)
Written and verbal d/c instructions given and understanding voiced. Threasa Heads CNM in earlier to discuss d/c plan

## 2013-05-31 ENCOUNTER — Other Ambulatory Visit: Payer: Self-pay

## 2013-07-31 ENCOUNTER — Ambulatory Visit: Payer: Self-pay | Admitting: Physician Assistant

## 2013-07-31 VITALS — BP 136/82 | HR 112 | Temp 98.0°F | Resp 18 | Ht 67.5 in | Wt 245.0 lb

## 2013-07-31 DIAGNOSIS — N390 Urinary tract infection, site not specified: Secondary | ICD-10-CM

## 2013-07-31 DIAGNOSIS — R350 Frequency of micturition: Secondary | ICD-10-CM

## 2013-07-31 DIAGNOSIS — R3915 Urgency of urination: Secondary | ICD-10-CM

## 2013-07-31 LAB — POCT URINALYSIS DIPSTICK
GLUCOSE UA: 100
Ketones, UA: 15
NITRITE UA: POSITIVE
Protein, UA: >=300
SPEC GRAV UA: 1.025
UROBILINOGEN UA: 1
pH, UA: 5.5

## 2013-07-31 LAB — POCT UA - MICROSCOPIC ONLY
CASTS, UR, LPF, POC: NEGATIVE
Crystals, Ur, HPF, POC: NEGATIVE
MUCUS UA: NEGATIVE
YEAST UA: NEGATIVE

## 2013-07-31 MED ORDER — PHENAZOPYRIDINE HCL 200 MG PO TABS: 200.0000 mg | ORAL_TABLET | Freq: Three times a day (TID) | ORAL | Status: DC | PRN

## 2013-07-31 MED ORDER — NITROFURANTOIN MONOHYD MACRO 100 MG PO CAPS: 100.0000 mg | ORAL_CAPSULE | Freq: Two times a day (BID) | ORAL | Status: DC

## 2013-07-31 NOTE — Progress Notes (Signed)
Subjective:    Patient ID: Madeline Scott, female    DOB: 08/23/1975, 38 y.o.   MRN: [id number]  HPI   Madeline Scott is a 38 yr old female here because "I have a UTI."  Symptoms include "a lot of pain," urgency, frequency.  Symptoms began 3 days ago.  Tried Azo and cranberry pills without relief.  Has been drinking lots of water.  Has had UTIs in the past, last about a yr ago.  +nausea, no vomiting.  LBP started last night.  Denies hematuria.  Denies fever.  Denies vaginal discharge.  LMP - "I have no idea, my periods are all over the place. It's been at least 3 wks."  She is not using contraception - "I can't get pregnant."  Declines pregnancy test today.   Review of Systems  Constitutional: Negative for fever and chills.  Respiratory: Negative.   Cardiovascular: Negative.   Gastrointestinal: Positive for nausea and abdominal pain (lower). Negative for vomiting.  Genitourinary: Positive for urgency and frequency. Negative for hematuria and vaginal discharge.  Musculoskeletal: Positive for back pain.  Skin: Negative.        Objective:   Physical Exam  Vitals reviewed. Constitutional: She is oriented to person, place, and time. She appears well-developed and well-nourished. No distress.  HENT:  Head: Normocephalic and atraumatic.  Eyes: Conjunctivae are normal. No scleral icterus.  Cardiovascular: Normal rate, regular rhythm and normal heart sounds.   Pulmonary/Chest: Effort normal and breath sounds normal. She has no wheezes. She has no rales.  Abdominal: Soft. There is tenderness in the suprapubic area. There is no CVA tenderness.  Neurological: She is alert and oriented to person, place, and time.  Skin: Skin is warm and dry.  Psychiatric: She has a normal mood and affect. Her behavior is normal.    Results for orders placed in visit on 07/31/13  POCT UA - MICROSCOPIC ONLY      Result Value Range   WBC, Ur, HPF, POC TNTC     RBC, urine, microscopic TNTC     Bacteria, U  Microscopic 3+     Mucus, UA negative     Epithelial cells, urine per micros 2-4     Crystals, Ur, HPF, POC negative     Casts, Ur, LPF, POC negative     Yeast, UA negative    POCT URINALYSIS DIPSTICK      Result Value Range   Color, UA orange     Clarity, UA cloudy     Glucose, UA 100     Bilirubin, UA small     Ketones, UA 15     Spec Grav, UA 1.025     Blood, UA large     pH, UA 5.5     Protein, UA >=300     Urobilinogen, UA 1.0     Nitrite, UA positive     Leukocytes, UA large (3+)         Assessment & Plan:  UTI (urinary tract infection) - Plan: nitrofurantoin, macrocrystal-monohydrate, (MACROBID) 100 MG capsule  Frequent urination - Plan: POCT UA - Microscopic Only, POCT urinalysis dipstick, phenazopyridine (PYRIDIUM) 200 MG tablet  Urgency of urination - Plan: POCT UA - Microscopic Only, POCT urinalysis dipstick, phenazopyridine (PYRIDIUM) 200 MG tablet   Madeline Scott is a 38 yr old female here with UTI.  She refuses hcg today citing cost but is confident that there is no chance of pregnancy.  Discussed with pt that knowing her pregnancy status  is important in selecting abx.  Will treat with macrobid x 5 days which is category B and should be safe in early pregnancy, should that be the case.  Pyridium for symptoms.  Push fluids.  Pt declines cx due to cost.  Will RTC if worsening or not improving.   Alonza Smoker MHS, PA-C [facility] 1/6/201511:53 AM

## 2013-07-31 NOTE — Patient Instructions (Signed)
Begin taking the nitrofurantoin (antibiotic) as directed.  Be sure to finish the full course.  Use the pyridium to help with symptoms over the next two days.  Continue drinking plenty of fluids.  If any symptoms are worsening or not improving, please let us know   Urinary Tract Infection Urinary tract infections (UTIs) can develop anywhere along your urinary tract. Your urinary tract is your body's drainage system for removing wastes and extra water. Your urinary tract includes two kidneys, two ureters, a bladder, and a urethra. Your kidneys are a pair of bean-shaped organs. Each kidney is about the size of your fist. They are located below your ribs, one on each side of your spine. CAUSES Infections are caused by microbes, which are microscopic organisms, including fungi, viruses, and bacteria. These organisms are so small that they can only be seen through a microscope. Bacteria are the microbes that most commonly cause UTIs. SYMPTOMS  Symptoms of UTIs may vary by age and gender of the patient and by the location of the infection. Symptoms in young women typically include a frequent and intense urge to urinate and a painful, burning feeling in the bladder or urethra during urination. Older women and men are more likely to be tired, shaky, and weak and have muscle aches and abdominal pain. A fever may mean the infection is in your kidneys. Other symptoms of a kidney infection include pain in your back or sides below the ribs, nausea, and vomiting. DIAGNOSIS To diagnose a UTI, your caregiver will ask you about your symptoms. Your caregiver also will ask to provide a urine sample. The urine sample will be tested for bacteria and white blood cells. White blood cells are made by your body to help fight infection. TREATMENT  Typically, UTIs can be treated with medication. Because most UTIs are caused by a bacterial infection, they usually can be treated with the use of antibiotics. The choice of antibiotic  and length of treatment depend on your symptoms and the type of bacteria causing your infection. HOME CARE INSTRUCTIONS  If you were prescribed antibiotics, take them exactly as your caregiver instructs you. Finish the medication even if you feel better after you have only taken some of the medication.  Drink enough water and fluids to keep your urine clear or pale yellow.  Avoid caffeine, tea, and carbonated beverages. They tend to irritate your bladder.  Empty your bladder often. Avoid holding urine for long periods of time.  Empty your bladder before and after sexual intercourse.  After a bowel movement, women should cleanse from front to back. Use each tissue only once. SEEK MEDICAL CARE IF:   You have back pain.  You develop a fever.  Your symptoms do not begin to resolve within 3 days. SEEK IMMEDIATE MEDICAL CARE IF:   You have severe back pain or lower abdominal pain.  You develop chills.  You have nausea or vomiting.  You have continued burning or discomfort with urination. MAKE SURE YOU:   Understand these instructions.  Will watch your condition.  Will get help right away if you are not doing well or get worse. Document Released: 04/21/2005 Document Revised: 01/11/2012 Document Reviewed: 08/20/2011 [facility] Patient Information 2014 [place].

## 2013-11-09 ENCOUNTER — Ambulatory Visit (INDEPENDENT_AMBULATORY_CARE_PROVIDER_SITE_OTHER): Payer: Self-pay | Admitting: Internal Medicine

## 2013-11-09 VITALS — BP 124/82 | HR 127 | Temp 98.9°F | Resp 16 | Ht 67.5 in | Wt 250.8 lb

## 2013-11-09 DIAGNOSIS — R3915 Urgency of urination: Secondary | ICD-10-CM

## 2013-11-09 DIAGNOSIS — R350 Frequency of micturition: Secondary | ICD-10-CM

## 2013-11-09 DIAGNOSIS — R300 Dysuria: Secondary | ICD-10-CM

## 2013-11-09 LAB — POCT URINALYSIS DIPSTICK
BILIRUBIN UA: NEGATIVE
GLUCOSE UA: NEGATIVE
KETONES UA: NEGATIVE
Nitrite, UA: POSITIVE
PROTEIN UA: 100
Spec Grav, UA: 1.015
Urobilinogen, UA: 1
pH, UA: 7.5

## 2013-11-09 LAB — POCT UA - MICROSCOPIC ONLY
Casts, Ur, LPF, POC: NEGATIVE
Crystals, Ur, HPF, POC: NEGATIVE
Yeast, UA: NEGATIVE

## 2013-11-09 MED ORDER — CIPROFLOXACIN HCL 250 MG PO TABS: 250.0000 mg | ORAL_TABLET | Freq: Two times a day (BID) | ORAL | Status: DC

## 2013-11-09 MED ORDER — PHENAZOPYRIDINE HCL 200 MG PO TABS: 200.0000 mg | ORAL_TABLET | Freq: Three times a day (TID) | ORAL | Status: DC | PRN

## 2013-11-09 NOTE — Progress Notes (Signed)
This chart was scribed for [employer]. Laney Pastor, MD by Marcha Dutton, ED Scribe. This patient was seen in room 4 and the patient's care was started at 6:40 PM.  Subjective:    Patient ID: Madeline Scott, female    DOB: 05-01-1976, 38 y.o.   MRN: [id number]  HPI HPI Comments: KAREY SUTHERS is a 38 y.o. female who presents to the [facility] complaining of a UTI that began this morning. She reports urinary frequency, burning, nausea. Pt states her most recent was January and that was the first one she'd had in years. Pt denies any back or abdominal pain.    Review of Systems  Genitourinary: Positive for dysuria and frequency.  All other systems reviewed and are negative.      Objective:   Physical Exam  Nursing note and vitals reviewed. Constitutional: She is oriented to person, place, and time. She appears well-developed and well-nourished. No distress.  HENT:  Head: Normocephalic and atraumatic.  Eyes: Conjunctivae and EOM are normal. Pupils are equal, round, and reactive to light.  Neck: Normal range of motion. Neck supple.  Pulmonary/Chest: Effort normal.  Musculoskeletal:  No CVA tenderness  Neurological: She is alert and oriented to person, place, and time. No cranial nerve deficit.  Psychiatric: She has a normal mood and affect. Her behavior is normal.   Filed Vitals:   11/09/13 1818  BP: 124/82  Pulse: 127  Temp: 98.9 F (37.2 C)  TempSrc: Oral  Resp: 16  Height: 5' 7.5" (1.715 m)  Weight: 250 lb 12.8 oz (113.762 kg)  SpO2: 100%     Results for orders placed in visit on 11/09/13  POCT UA - MICROSCOPIC ONLY      Result Value Ref Range   WBC, Ur, HPF, POC       RBC, urine, microscopic       Bacteria, U Microscopic       Mucus, UA       Epithelial cells, urine per micros       Crystals, Ur, HPF, POC       Casts, Ur, LPF, POC       Yeast, UA      POCT URINALYSIS DIPSTICK      Result Value Ref Range   Color, UA orange       Clarity, UA clear     Glucose, UA neg     Bilirubin, UA neg     Ketones, UA neg     Spec Grav, UA 1.015     Blood, UA tr-intact     pH, UA 7.5     Protein, UA 100     Urobilinogen, UA 1.0     Nitrite, UA positive     Leukocytes, UA Trace          Assessment & Plan:  DIAGNOSTIC STUDIES: Oxygen Saturation is 100% on RA, normal by my interpretation.    COORDINATION OF CARE: Pt advised of plan for treatment and pt agrees.  Dysuria - Plan: POCT UA - Microscopic Only, POCT urinalysis dipstick, Urine culture, CANCELED: POCT urinalysis dipstick  Urinary frequency  Frequent urination - Plan: phenazopyridine (PYRIDIUM) 200 MG tablet  Urgency of urination - Plan: phenazopyridine (PYRIDIUM) 200 MG tablet    Meds ordered this encounter  Medications  . ciprofloxacin (CIPRO) 250 MG tablet    Sig: Take 1 tablet (250 mg total) by mouth 2 (two) times daily.    Dispense:  10 tablet  Refill:  0    I have completed the patient encounter in its entirety as documented by the scribe, with editing by me where necessary. Raynard Mapps P. Laney Pastor, M.D.

## 2014-01-11 ENCOUNTER — Emergency Department: Payer: Self-pay | Admitting: Emergency Medicine

## 2014-02-04 ENCOUNTER — Encounter (HOSPITAL_COMMUNITY): Payer: Self-pay | Admitting: Emergency Medicine

## 2014-02-04 ENCOUNTER — Emergency Department (HOSPITAL_COMMUNITY): Payer: Managed Care, Other (non HMO)

## 2014-02-04 ENCOUNTER — Emergency Department (HOSPITAL_COMMUNITY)
Admission: EM | Admit: 2014-02-04 | Discharge: 2014-02-04 | Disposition: A | Discharge status: Home / Self Care | Payer: Managed Care, Other (non HMO) | Attending: Emergency Medicine | Admitting: Emergency Medicine

## 2014-02-04 DIAGNOSIS — R059 Cough, unspecified: Secondary | ICD-10-CM | POA: Insufficient documentation

## 2014-02-04 DIAGNOSIS — Z79899 Other long term (current) drug therapy: Secondary | ICD-10-CM | POA: Insufficient documentation

## 2014-02-04 DIAGNOSIS — E079 Disorder of thyroid, unspecified: Secondary | ICD-10-CM | POA: Insufficient documentation

## 2014-02-04 DIAGNOSIS — R0789 Other chest pain: Secondary | ICD-10-CM | POA: Insufficient documentation

## 2014-02-04 DIAGNOSIS — E282 Polycystic ovarian syndrome: Secondary | ICD-10-CM | POA: Insufficient documentation

## 2014-02-04 DIAGNOSIS — R05 Cough: Secondary | ICD-10-CM | POA: Insufficient documentation

## 2014-02-04 DIAGNOSIS — K219 Gastro-esophageal reflux disease without esophagitis: Secondary | ICD-10-CM | POA: Insufficient documentation

## 2014-02-04 DIAGNOSIS — Z885 Allergy status to narcotic agent status: Secondary | ICD-10-CM | POA: Insufficient documentation

## 2014-02-04 DIAGNOSIS — Z87891 Personal history of nicotine dependence: Secondary | ICD-10-CM | POA: Insufficient documentation

## 2014-02-04 DIAGNOSIS — E119 Type 2 diabetes mellitus without complications: Secondary | ICD-10-CM | POA: Insufficient documentation

## 2014-02-04 DIAGNOSIS — R0602 Shortness of breath: Secondary | ICD-10-CM

## 2014-02-04 DIAGNOSIS — R079 Chest pain, unspecified: Secondary | ICD-10-CM

## 2014-02-04 HISTORY — DX: Type 2 diabetes mellitus without complications: E11.9

## 2014-02-04 HISTORY — DX: Disorder of thyroid, unspecified: E07.9

## 2014-02-04 HISTORY — DX: Gastro-esophageal reflux disease without esophagitis: K21.9

## 2014-02-04 LAB — CBC
HCT: 38.5 % (ref 36.0–46.0)
HEMOGLOBIN: 13.5 g/dL (ref 12.0–15.0)
MCH: 28.5 pg (ref 26.0–34.0)
MCHC: 35.1 g/dL (ref 30.0–36.0)
MCV: 81.4 fL (ref 78.0–100.0)
Platelets: 260 10*3/uL (ref 150–400)
RBC: 4.73 MIL/uL (ref 3.87–5.11)
RDW: 13.3 % (ref 11.5–15.5)
WBC: 10.7 10*3/uL — ABNORMAL HIGH (ref 4.0–10.5)

## 2014-02-04 LAB — I-STAT TROPONIN, ED
TROPONIN I, POC: 0 ng/mL (ref 0.00–0.08)
TROPONIN I, POC: 0 ng/mL (ref 0.00–0.08)

## 2014-02-04 LAB — BASIC METABOLIC PANEL
Anion gap: 13 (ref 5–15)
BUN: 15 mg/dL (ref 6–23)
CO2: 27 meq/L (ref 19–32)
Calcium: 9.4 mg/dL (ref 8.4–10.5)
Chloride: 101 mEq/L (ref 96–112)
Creatinine, Ser: 0.78 mg/dL (ref 0.50–1.10)
GFR calc Af Amer: >90 mL/min (ref >90)
GFR calc non Af Amer: >90 mL/min (ref >90)
Glucose, Bld: 91 mg/dL (ref 70–99)
Potassium: 3.8 mEq/L (ref 3.7–5.3)
SODIUM: 141 meq/L (ref 137–147)

## 2014-02-04 LAB — D-DIMER, QUANTITATIVE: D-Dimer, Quant: <0.27 ug/mL-FEU (ref 0.00–0.48)

## 2014-02-04 LAB — PRO B NATRIURETIC PEPTIDE: PRO B NATRI PEPTIDE: 22.5 pg/mL (ref 0–125)

## 2014-02-04 MED ORDER — ASPIRIN 81 MG PO CHEW
324.0000 mg | CHEWABLE_TABLET | Freq: Once | ORAL | Status: AC
  Administered 2014-02-04: 324 mg via ORAL
  Filled 2014-02-04: qty 4

## 2014-02-04 NOTE — ED Notes (Signed)
Discharge instructions reviewed with pt. Pt verbalized understanding.   

## 2014-02-04 NOTE — ED Notes (Addendum)
Pt here for some generalized cp that started yesterday. Pt has a dry cough that she stated started after she quit smoking. Pt describes cp as a discomfort more so than a pain. Pt reports deep breaths makes pain worse. Pt denies any activity that could have brought the pain on.

## 2014-02-04 NOTE — ED Provider Notes (Signed)
CSN: [id number]     Arrival date & time 02/04/14  1805 History   First MD Initiated Contact with Patient 02/04/14 2109     Chief Complaint  Patient presents with  . Chest Pain     (Consider location/radiation/quality/duration/timing/severity/associated sxs/prior Treatment) HPI Comments: 38 year old female with history of thyroid disease, diabetes type 2, reflux, PCO S., cholecystectomy, past smoker presents with intermittent left-sided sharp chest pain lasting one or 2 seconds at a time with mild shortness of breath that began yesterday. Patient had a dry hacking cough as well and no specific pleuritic component. Patient does have mild shortness of breath with exertion for the past month. No CHF or cardiac or blood clot history.Patient denies blood clot history, active cancer, recent major trauma or surgery, unilateral leg swelling, recent long travel, hemoptysis or oral contraceptives. Patient has had mild left calf tenderness without injury yesterday. No cardiac disease known. No exertional chest pain or diaphoresis.  Patient is a 38 y.o. female presenting with chest pain. The history is provided by the patient.  Chest Pain Associated symptoms: cough and shortness of breath   Associated symptoms: no abdominal pain, no back pain, no fever, no headache and not vomiting     Past Medical History  Diagnosis Date  . Thyroid disease   . Diabetes mellitus without complication   . GERD (gastroesophageal reflux disease)   . PCOS (polycystic ovarian syndrome)    Past Surgical History  Procedure Laterality Date  . Thyroidectomy    . Cholecystectomy     History reviewed. No pertinent family history. History  Substance Use Topics  . Smoking status: Former Smoker    Types: Cigarettes  . Smokeless tobacco: Not on file  . Alcohol Use: No   OB History   Grav Para Term Preterm Abortions TAB SAB Ect Mult Living                 Review of Systems  Constitutional: Negative for fever and  chills.  HENT: Negative for congestion.   Eyes: Negative for visual disturbance.  Respiratory: Positive for cough and shortness of breath.   Cardiovascular: Positive for chest pain.  Gastrointestinal: Negative for vomiting and abdominal pain.  Genitourinary: Negative for dysuria and flank pain.  Musculoskeletal: Negative for back pain, neck pain and neck stiffness.  Skin: Negative for rash.  Neurological: Negative for light-headedness and headaches.      Allergies  Demerol; Codeine; Percocet; and Vicodin  Home Medications   Prior to Admission medications   Medication Sig Start Date End Date Taking? Authorizing Provider  diphenhydrAMINE (SOMINEX) 25 MG tablet Take 25 mg by mouth at bedtime as needed for sleep.   Yes Historical Provider, MD  levothyroxine (SYNTHROID, LEVOTHROID) 175 MCG tablet Take 175 mcg by mouth daily before breakfast.   Yes Historical Provider, MD  omeprazole (PRILOSEC) 20 MG capsule Take 20 mg by mouth daily.   Yes Historical Provider, MD   BP 135/93  Pulse 86  Temp(Src) 98 F (36.7 C) (Oral)  Resp 18  SpO2 99% Physical Exam  Nursing note and vitals reviewed. Constitutional: She is oriented to person, place, and time. She appears well-developed and well-nourished.  HENT:  Head: Normocephalic and atraumatic.  Eyes: Conjunctivae are normal. Right eye exhibits no discharge. Left eye exhibits no discharge.  Neck: Normal range of motion. Neck supple. No tracheal deviation present.  Cardiovascular: Normal rate, regular rhythm and intact distal pulses.   Pulmonary/Chest: Effort normal and breath sounds normal.  Abdominal: Soft.  She exhibits no distension. There is no tenderness. There is no guarding.  Musculoskeletal: She exhibits no edema and no tenderness.  Neurological: She is alert and oriented to person, place, and time.  Skin: Skin is warm. No rash noted.  Psychiatric: She has a normal mood and affect.    ED Course  Procedures (including critical  care time) Labs Review Labs Reviewed  CBC - Abnormal; Notable for the following:    WBC 10.7 (*)    All other components within normal limits  BASIC METABOLIC PANEL  D-DIMER, QUANTITATIVE  PRO B NATRIURETIC PEPTIDE  I-STAT TROPOININ, ED  Randolm Idol, ED    Imaging Review Dg Chest 2 View  02/04/2014   CLINICAL DATA:  Chest pain.  EXAM: CHEST  2 VIEW  COMPARISON:  None.  FINDINGS: The heart size and mediastinal contours are within normal limits. Both lungs are clear. No pneumothorax or pleural effusion is noted. The visualized skeletal structures are unremarkable.  IMPRESSION: No acute cardiopulmonary abnormality seen.   Electronically Signed   By: Sabino Dick M.D.   On: 02/04/2014 19:44     EKG Interpretation   Date/Time:  Monday February 04 2014 18:10:02 EDT Ventricular Rate:  96 PR Interval:  142 QRS Duration: 82 QT Interval:  344 QTC Calculation: 434 R Axis:   -177 Text Interpretation:  Normal sinus rhythm with sinus arrhythmia non  specific ekg changes Confirmed by Reather Converse  MD, Niara Bunker ([id number]) on 02/04/2014  9:30:05 PM      MDM   Final diagnoses:  Shortness of breath  Chest pain, unspecified chest pain type   Chest pain very atypical, patient has 2 cardiac risk factors and no blood clot risk factors. Patient has no chest pain on recheck and observed in ER for 5 hours. Delta troponin negative and d-dimer negative. Patient low risk cardiac and low risk blood clots. Patient well-appearing and I discussed observation in the hospital versus close outpatient followup for stress test. Patient has significant other comfortable with outpatient followup and reasons to return given. Aspirin given in in ER prior to discharge.  HEART SCORE 2.    Results and differential diagnosis were discussed with the patient/parent/guardian. Close follow up outpatient was discussed, comfortable with the plan.   Medications  aspirin chewable tablet 324 mg (not administered)    Filed Vitals:    02/04/14 2115 02/04/14 2130 02/04/14 2145 02/04/14 2200  BP: 127/85 140/90 143/79 118/70  Pulse: 91 92 89 82  Temp:      TempSrc:      Resp: 15 23 17 21   SpO2: 100% 100% 97% 99%         Mariea Clonts, MD 02/04/14 2335

## 2014-02-04 NOTE — ED Notes (Addendum)
Presents with left sided chest pain described as sharp associated with SOB, pain began yesterday. Pt has deep dry hacking cough-reports that she has that cough since December. ALso c/o left lower calf pain-no redness, erythema or induration noted. Breath sounds clear, Sats 99%, speaks in full sentences. Chest pain is made worse by over exertion. Nothing makes pain better.  She denies recent hx of long trip- she is a former smoker and endorses a sedentary lifestyle.

## 2014-02-04 NOTE — Discharge Instructions (Signed)
If you were given medicines take as directed.  If you are on coumadin or contraceptives realize their levels and effectiveness is altered by many different medicines.  If you have any reaction (rash, tongues swelling, other) to the medicines stop taking and see a physician.   Please follow up as directed and return to the ER or see a physician for new or worsening symptoms.  Thank you. Filed Vitals:   02/04/14 2115 02/04/14 2130 02/04/14 2145 02/04/14 2200  BP: 127/85 140/90 143/79 118/70  Pulse: 91 92 89 82  Temp:      TempSrc:      Resp: 15 23 17 21   SpO2: 100% 100% 97% 99%    Chest Pain (Nonspecific) It is often hard to give a specific diagnosis for the cause of chest pain. There is always a chance that your pain could be related to something serious, such as a heart attack or a blood clot in the lungs. You need to follow up with your health care provider for further evaluation. CAUSES   Heartburn.  Pneumonia or bronchitis.  Anxiety or stress.  Inflammation around your heart (pericarditis) or lung (pleuritis or pleurisy).  A blood clot in the lung.  A collapsed lung (pneumothorax). It can develop suddenly on its own (spontaneous pneumothorax) or from trauma to the chest.  Shingles infection (herpes zoster virus). The chest wall is composed of bones, muscles, and cartilage. Any of these can be the source of the pain.  The bones can be bruised by injury.  The muscles or cartilage can be strained by coughing or overwork.  The cartilage can be affected by inflammation and become sore (costochondritis). DIAGNOSIS  Lab tests or other studies may be needed to find the cause of your pain. Your health care provider may have you take a test called an ambulatory electrocardiogram (ECG). An ECG records your heartbeat patterns over a 24-hour period. You may also have other tests, such as:  Transthoracic echocardiogram (TTE). During echocardiography, sound waves are used to evaluate how  blood flows through your heart.  Transesophageal echocardiogram (TEE).  Cardiac monitoring. This allows your health care provider to monitor your heart rate and rhythm in real time.  Holter monitor. This is a portable device that records your heartbeat and can help diagnose heart arrhythmias. It allows your health care provider to track your heart activity for several days, if needed.  Stress tests by exercise or by giving medicine that makes the heart beat faster. TREATMENT   Treatment depends on what may be causing your chest pain. Treatment may include:  Acid blockers for heartburn.  Anti-inflammatory medicine.  Pain medicine for inflammatory conditions.  Antibiotics if an infection is present.  You may be advised to change lifestyle habits. This includes stopping smoking and avoiding alcohol, caffeine, and chocolate.  You may be advised to keep your head raised (elevated) when sleeping. This reduces the chance of acid going backward from your stomach into your esophagus. Most of the time, nonspecific chest pain will improve within 2-3 days with rest and mild pain medicine.  HOME CARE INSTRUCTIONS   If antibiotics were prescribed, take them as directed. Finish them even if you start to feel better.  For the next few days, avoid physical activities that bring on chest pain. Continue physical activities as directed.  Do not use any tobacco products, including cigarettes, chewing tobacco, or electronic cigarettes.  Avoid drinking alcohol.  Only take medicine as directed by your health care provider.  Follow your health care provider's suggestions for further testing if your chest pain does not go away.  Keep any follow-up appointments you made. If you do not go to an appointment, you could develop lasting (chronic) problems with pain. If there is any problem keeping an appointment, call to reschedule. SEEK MEDICAL CARE IF:   Your chest pain does not go away, even after  treatment.  You have a rash with blisters on your chest.  You have a fever. SEEK IMMEDIATE MEDICAL CARE IF:   You have increased chest pain or pain that spreads to your arm, neck, jaw, back, or abdomen.  You have shortness of breath.  You have an increasing cough, or you cough up blood.  You have severe back or abdominal pain.  You feel nauseous or vomit.  You have severe weakness.  You faint.  You have chills. This is an emergency. Do not wait to see if the pain will go away. Get medical help at once. Call your local emergency services (911 in [place] Do not drive yourself to the hospital. MAKE SURE YOU:   Understand these instructions.  Will watch your condition.  Will get help right away if you are not doing well or get worse. Document Released: 04/21/2005 Document Revised: 07/17/2013 Document Reviewed: 02/15/2008 [facility] Patient Information 2015 [place], [place]. This information is not intended to replace advice given to you by your health care provider. Make sure you discuss any questions you have with your health care provider.

## 2014-02-28 ENCOUNTER — Encounter: Payer: Self-pay | Admitting: Interventional Cardiology

## 2014-02-28 ENCOUNTER — Ambulatory Visit (INDEPENDENT_AMBULATORY_CARE_PROVIDER_SITE_OTHER): Payer: Medicare HMO | Admitting: Interventional Cardiology

## 2014-02-28 VITALS — BP 128/88 | HR 86 | Ht 67.5 in | Wt 252.8 lb

## 2014-02-28 DIAGNOSIS — R0989 Other specified symptoms and signs involving the circulatory and respiratory systems: Secondary | ICD-10-CM

## 2014-02-28 DIAGNOSIS — E1165 Type 2 diabetes mellitus with hyperglycemia: Secondary | ICD-10-CM

## 2014-02-28 DIAGNOSIS — E039 Hypothyroidism, unspecified: Secondary | ICD-10-CM | POA: Insufficient documentation

## 2014-02-28 DIAGNOSIS — E282 Polycystic ovarian syndrome: Secondary | ICD-10-CM

## 2014-02-28 DIAGNOSIS — R079 Chest pain, unspecified: Secondary | ICD-10-CM | POA: Insufficient documentation

## 2014-02-28 DIAGNOSIS — IMO0001 Reserved for inherently not codable concepts without codable children: Secondary | ICD-10-CM

## 2014-02-28 DIAGNOSIS — R0600 Dyspnea, unspecified: Secondary | ICD-10-CM | POA: Insufficient documentation

## 2014-02-28 DIAGNOSIS — R0609 Other forms of dyspnea: Secondary | ICD-10-CM

## 2014-02-28 DIAGNOSIS — IMO0002 Reserved for concepts with insufficient information to code with codable children: Secondary | ICD-10-CM | POA: Insufficient documentation

## 2014-02-28 DIAGNOSIS — R0789 Other chest pain: Secondary | ICD-10-CM

## 2014-02-28 LAB — BASIC METABOLIC PANEL
BUN: 16 mg/dL (ref 6–23)
CHLORIDE: 101 meq/L (ref 96–112)
CO2: 27 meq/L (ref 19–32)
Calcium: 9.2 mg/dL (ref 8.4–10.5)
Creatinine, Ser: 0.7 mg/dL (ref 0.4–1.2)
GFR: 94.13 mL/min (ref >60.00)
Glucose, Bld: 97 mg/dL (ref 70–99)
POTASSIUM: 3.4 meq/L — AB (ref 3.5–5.1)
SODIUM: 137 meq/L (ref 135–145)

## 2014-02-28 LAB — HEMOGLOBIN: HEMOGLOBIN: 13.6 g/dL (ref 12.0–15.0)

## 2014-02-28 LAB — BRAIN NATRIURETIC PEPTIDE: Pro B Natriuretic peptide (BNP): 19 pg/mL (ref 0.0–100.0)

## 2014-02-28 NOTE — Progress Notes (Signed)
Patient ID: Madeline Scott, female   DOB: 28-Nov-1975, 38 y.o.   MRN: [id number]   Date: 02/28/2014 ID: Madeline Scott, DOB Feb 27, 1976, MRN [id number] PCP: Sheela Stack, MD  Reason: dyspnea  ASSESSMENT;  1. Dyspnea on exertion and orthopnea of uncertain etiology 2. Atypical chest pain, sharp and fleeting 3. Morbid obesity 4. Hypothyroidism 5. Polycystic ovarian syndrome 6. Diabetes mellitus, type II, control unknown  PLAN:  1. Basic metabolic panel, CBC, and 2-D Doppler echocardiogram 2. Obtain the emergency room records which can not currently available in epic 3. Weight loss 4. Salt restriction 5. Consider ischemic evaluation of clues from the lab and echo   SUBJECTIVE: Madeline Scott is a 38 y.o. female who is referred from the emergency room at [facility]. She presented with fleeting episodes of left upper chest discomfort. Sharp shooting discomfort would occur intermittently. No prolonged episodes of discomfort occurred. The episodes would last seconds and resolved. This was in addition to a 6-8 month history of orthopnea, PND, and exertional dyspnea. She also feels that she has been retaining fluid. She has occasional lower extremity edema. No history of heart disease, hypertension, or kidney disease. She is diabetic and smoke cigarettes until January when they were discontinued.   Allergies  Allergen Reactions  . Demerol Anaphylaxis  . Hydrocodone Nausea Only  . Percocet [Oxycodone-Acetaminophen] Nausea Only  . Tramadol Nausea Only    Current Outpatient Prescriptions on File Prior to Visit  Medication Sig Dispense Refill  . levothyroxine (SYNTHROID, LEVOTHROID) 175 MCG tablet Take 175 mcg by mouth as directed. Pt takes 6 days a week, everyday but Tuesdays.      Marland Kitchen omeprazole (PRILOSEC) 20 MG capsule Take 20 mg by mouth daily.       No current facility-administered medications on file prior to visit.    Past Medical History  Diagnosis Date    . PCOS (polycystic ovarian syndrome)   . Diabetes mellitus     insulin resistance w/ PCOS  . Anxiety   . Depression     doing good, no meds in 2 yrs.  . Endometriosis   . Hypothyroid   . Chronic kidney disease     kidney stones  . IBS (irritable bowel syndrome)     Past Surgical History  Procedure Laterality Date  . Cholecystectomy    . Total thyroidectomy    . Ovarian cyst removal      History   Social History  . Marital Status: Married    Spouse Name: N/A    Number of Children: N/A  . Years of Education: N/A   Occupational History  . Not on file.   Social History Main Topics  . Smoking status: Former Smoker -- 0.25 packs/day for 15 years    Types: Cigarettes    Quit date: 05/16/2012  . Smokeless tobacco: Never Used  . Alcohol Use: No  . Drug Use: No  . Sexual Activity: Yes   Other Topics Concern  . Not on file   Social History Narrative  . No narrative on file    Family History  Problem Relation Age of Onset  . Other Neg Hx   . Diabetes Mother   . Hypertension Father   . Diabetes Maternal Grandmother   . Stroke Maternal Grandmother     ROS: Appetite is stable. History of hypothyroidism. No difficulty swallowing. Denies claudication. No palpitations or syncope. No prior history of stroke. She is disabled and does not work.. Denies syncope. Denies wheezing.  Other systems negative for complaints.  OBJECTIVE: BP 128/88  Pulse 86  Ht 5' 7.5" (1.715 m)  Wt 252 lb 12.8 oz (114.669 kg)  BMI 38.99 kg/m2,  General: No acute distress, obese, older than stated age in appearance HEENT: normal no jaundice or pallor Neck: JVD difficult to appreciate due to obesity. Carotids 2+ bilateral without bruits Chest:clear to auscultation and percussion Cardiac: Murmur: none. Gallop: S4. Rhythm: normal. Other: normal Abdomen: Bruit: absent. Pulsation: absent Extremities: Edema: absent. Pulses: 2+ and symmetric Neuro: normal Psych: anxious  ECG: normal sinus  rhythm, nonspecific ST-T abnormality

## 2014-02-28 NOTE — Patient Instructions (Signed)
Your physician recommends that you continue on your current medications as directed. Please refer to the Current Medication list given to you today.  Lab Today: Bmet. Bnp  Your physician has requested that you have an echocardiogram. Echocardiography is a painless test that uses sound waves to create images of your heart. It provides your doctor with information about the size and shape of your heart and how well your heart's chambers and valves are working. This procedure takes approximately one hour. There are no restrictions for this procedure.  Follow up pending results

## 2014-03-07 ENCOUNTER — Ambulatory Visit (HOSPITAL_COMMUNITY): Payer: Managed Care, Other (non HMO) | Attending: Internal Medicine | Admitting: Radiology

## 2014-03-07 DIAGNOSIS — R079 Chest pain, unspecified: Secondary | ICD-10-CM | POA: Diagnosis not present

## 2014-03-07 DIAGNOSIS — R0600 Dyspnea, unspecified: Secondary | ICD-10-CM

## 2014-03-07 DIAGNOSIS — R072 Precordial pain: Secondary | ICD-10-CM

## 2014-03-07 DIAGNOSIS — R0789 Other chest pain: Secondary | ICD-10-CM

## 2014-03-07 NOTE — Progress Notes (Signed)
Echocardiogram performed.  

## 2014-03-15 ENCOUNTER — Telehealth: Payer: Self-pay | Admitting: Interventional Cardiology

## 2014-03-15 NOTE — Telephone Encounter (Signed)
pt adv that Dr.Smith will return to the office on 8/26. pt given prelim echo results.adv pt I will call back once Dr.Smith reviews her echo.pt agreeable and verbalized understanding.

## 2014-03-15 NOTE — Telephone Encounter (Signed)
New problem   Pt wanting results of her echo test she had done last week. Please call pt.

## 2014-03-19 NOTE — Telephone Encounter (Signed)
[email or web address] to let pt know echo was reviewed by Dr.Smith and pt echo is normal.

## 2014-03-19 NOTE — Telephone Encounter (Signed)
Message copied by Lamar Laundry on Tue Mar 19, 2014  9:17 AM ------      Message from: Daneen Schick      Created: Fri Mar 15, 2014 10:03 PM       Normal ------

## 2014-05-27 ENCOUNTER — Encounter: Payer: Self-pay | Admitting: Interventional Cardiology

## 2014-12-07 ENCOUNTER — Ambulatory Visit (INDEPENDENT_AMBULATORY_CARE_PROVIDER_SITE_OTHER): Payer: Managed Care, Other (non HMO) | Admitting: Physician Assistant

## 2014-12-07 VITALS — BP 122/84 | HR 89 | Temp 98.1°F | Ht 68.0 in | Wt 252.2 lb

## 2014-12-07 DIAGNOSIS — R300 Dysuria: Secondary | ICD-10-CM | POA: Diagnosis not present

## 2014-12-07 DIAGNOSIS — R319 Hematuria, unspecified: Secondary | ICD-10-CM | POA: Diagnosis not present

## 2014-12-07 DIAGNOSIS — D179 Benign lipomatous neoplasm, unspecified: Secondary | ICD-10-CM | POA: Diagnosis not present

## 2014-12-07 LAB — POCT URINALYSIS DIPSTICK
Bilirubin, UA: NEGATIVE
Glucose, UA: NEGATIVE
KETONES UA: NEGATIVE
Leukocytes, UA: NEGATIVE
Nitrite, UA: NEGATIVE
PH UA: 7.5
PROTEIN UA: NEGATIVE
Spec Grav, UA: 1.02
UROBILINOGEN UA: 0.2

## 2014-12-07 LAB — POCT UA - MICROSCOPIC ONLY
Bacteria, U Microscopic: NEGATIVE
CRYSTALS, UR, HPF, POC: NEGATIVE
Casts, Ur, LPF, POC: NEGATIVE
MUCUS UA: NEGATIVE
Yeast, UA: NEGATIVE

## 2014-12-07 MED ORDER — FLUCONAZOLE 150 MG PO TABS: 150.0000 mg | ORAL_TABLET | Freq: Once | ORAL | Status: DC

## 2014-12-07 MED ORDER — CIPROFLOXACIN HCL 500 MG PO TABS: 500.0000 mg | ORAL_TABLET | Freq: Two times a day (BID) | ORAL | Status: DC

## 2014-12-07 MED ORDER — PHENAZOPYRIDINE HCL 200 MG PO TABS: 200.0000 mg | ORAL_TABLET | Freq: Three times a day (TID) | ORAL | Status: DC | PRN

## 2014-12-07 NOTE — Progress Notes (Signed)
Subjective:    Patient ID: Madeline Scott, female    DOB: Dec 06, 1975, 39 y.o.   MRN: [id number]  HPI Patient presents for urinary frequency and dysuria that have been present for 1 month. Usually is able to get rid of sx by increasing water intake, but did not work this time. Endorses urgency, incomplete emptying, and lower back/lower abdominal pain. Denies fever, hematuria, N/V, flank pain, or vaginal discharge. Had D&C 2 years ago and has irregular menses. Sexually active with husband only. Med allergies to demerol, codeine, hydrocodone, percocet, and tramadol.  Has 1 bump on left arm and 1 on right arm that have been present for 10 years. Right arm bump has become more painful over last month especially, when she bumps arm. Denies erythema or bump growing in size.   Former smoker of 15 years. Smoked 1/4 pack daily and quit Dec. 22, 2014.  Review of Systems As noted above.     Objective:   Physical Exam  Constitutional: She is oriented to person, place, and time. She appears well-developed and well-nourished. No distress.  Blood pressure 122/84, pulse 89, temperature 98.1 F (36.7 C), temperature source Oral, height 5\' 8"  (1.727 m), weight 252 lb 3.2 oz (114.397 kg), last menstrual period 12/07/2014, SpO2 98 %.  HENT:  Head: Normocephalic and atraumatic.  Right Ear: External ear normal.  Left Ear: External ear normal.  Eyes: Conjunctivae are normal. Pupils are equal, round, and reactive to light. Right eye exhibits no discharge. Left eye exhibits no discharge.  Cardiovascular: Normal rate, regular rhythm and normal heart sounds.  Exam reveals no gallop and no friction rub.   No murmur heard. Pulmonary/Chest: Effort normal and breath sounds normal. No respiratory distress. She has no wheezes. She has no rales. She exhibits no tenderness.  Abdominal: Soft. Bowel sounds are normal. She exhibits no distension and no mass. There is tenderness in the suprapubic area. There is no  rigidity, no rebound, no guarding, no CVA tenderness, no tenderness at McBurney's point and negative Murphy's sign. No hernia.  Neurological: She is alert and oriented to person, place, and time.  Skin: Skin is warm and dry. No rash noted. She is not diaphoretic. No erythema. No pallor.  2 lipomas one on left lower arm and one on right forearm   Results for orders placed or performed in visit on 12/07/14  POCT UA - Microscopic Only  Result Value Ref Range   WBC, Ur, HPF, POC 0-1    RBC, urine, microscopic 1-3    Bacteria, U Microscopic neg    Mucus, UA neg    Epithelial cells, urine per micros 1-4    Crystals, Ur, HPF, POC neg    Casts, Ur, LPF, POC neg    Yeast, UA neg   POCT urinalysis dipstick  Result Value Ref Range   Color, UA yellow    Clarity, UA clear    Glucose, UA neg    Bilirubin, UA neg    Ketones, UA neg    Spec Grav, UA 1.020    Blood, UA moderate    pH, UA 7.5    Protein, UA neg    Urobilinogen, UA 0.2    Nitrite, UA neg    Leukocytes, UA Negative       Assessment & Plan:  1. Dysuria 2. Hematuria Continue to stay hydrated. RTC in 1 week for repeat UA. If not improvement of hematuria will send to urology.  - POCT UA - Microscopic Only -  POCT urinalysis dipstick - Urine culture - phenazopyridine (PYRIDIUM) 200 MG tablet; Take 1 tablet (200 mg total) by mouth 3 (three) times daily as needed for pain.  Dispense: 10 tablet; Refill: 0 - ciprofloxacin (CIPRO) 500 MG tablet; Take 1 tablet (500 mg total) by mouth 2 (two) times daily.  Dispense: 20 tablet; Refill: 0  3. Lipoma - Ambulatory referral to Dermatology   Alveta Heimlich PA-C  [facility] 12/07/2014 11:11 AM

## 2014-12-08 LAB — URINE CULTURE: Colony Count: 5000

## 2014-12-10 ENCOUNTER — Encounter: Payer: Self-pay | Admitting: Physician Assistant

## 2014-12-13 ENCOUNTER — Telehealth: Payer: Self-pay

## 2014-12-13 NOTE — Telephone Encounter (Signed)
[phone]  Husband called  And wants someone to call his wife, she has a question and does not call anyone.

## 2014-12-13 NOTE — Telephone Encounter (Signed)
Pt needs a refill of pyridium. Her symptoms have not improved since her last visit.

## 2014-12-14 ENCOUNTER — Telehealth: Payer: Self-pay

## 2014-12-14 ENCOUNTER — Other Ambulatory Visit: Payer: Self-pay | Admitting: Physician Assistant

## 2014-12-14 DIAGNOSIS — R300 Dysuria: Secondary | ICD-10-CM

## 2014-12-14 NOTE — Telephone Encounter (Signed)
Patient requesting a refill on her "Pyridium" . Per patient she is having a lot of pain and discomforter and request it to be sent to [facility]. Patiens call back number is [phone]

## 2014-12-16 MED ORDER — PHENAZOPYRIDINE HCL 200 MG PO TABS: 200.0000 mg | ORAL_TABLET | Freq: Three times a day (TID) | ORAL | Status: DC | PRN

## 2014-12-16 NOTE — Telephone Encounter (Signed)
Gave pt message.

## 2014-12-16 NOTE — Telephone Encounter (Signed)
Pyridium refilled - this will be the last refill. Tishira put in future order for UA at last visit d/t blood in urine. Please tell pt to return for lab only visit for repeat UA. Nicola Girt will review and if needed will refer to urology.

## 2015-01-20 ENCOUNTER — Other Ambulatory Visit: Payer: Self-pay

## 2015-11-14 DIAGNOSIS — N200 Calculus of kidney: Secondary | ICD-10-CM | POA: Insufficient documentation

## 2015-11-14 DIAGNOSIS — E063 Autoimmune thyroiditis: Secondary | ICD-10-CM | POA: Insufficient documentation

## 2015-11-14 DIAGNOSIS — K2100 Gastro-esophageal reflux disease with esophagitis, without bleeding: Secondary | ICD-10-CM | POA: Insufficient documentation

## 2016-06-10 DIAGNOSIS — F321 Major depressive disorder, single episode, moderate: Secondary | ICD-10-CM | POA: Insufficient documentation

## 2016-06-25 DIAGNOSIS — F5101 Primary insomnia: Secondary | ICD-10-CM | POA: Insufficient documentation

## 2016-07-28 ENCOUNTER — Encounter (HOSPITAL_COMMUNITY): Payer: Self-pay | Admitting: Adult Health

## 2016-07-28 ENCOUNTER — Emergency Department (HOSPITAL_COMMUNITY)
Admission: EM | Admit: 2016-07-28 | Discharge: 2016-07-28 | Disposition: A | Discharge status: Home / Self Care | Payer: Self-pay | Attending: Physician Assistant | Admitting: Physician Assistant

## 2016-07-28 ENCOUNTER — Emergency Department (HOSPITAL_COMMUNITY): Payer: Self-pay

## 2016-07-28 DIAGNOSIS — E039 Hypothyroidism, unspecified: Secondary | ICD-10-CM | POA: Insufficient documentation

## 2016-07-28 DIAGNOSIS — N189 Chronic kidney disease, unspecified: Secondary | ICD-10-CM | POA: Insufficient documentation

## 2016-07-28 DIAGNOSIS — Z87891 Personal history of nicotine dependence: Secondary | ICD-10-CM | POA: Insufficient documentation

## 2016-07-28 DIAGNOSIS — Z79899 Other long term (current) drug therapy: Secondary | ICD-10-CM | POA: Insufficient documentation

## 2016-07-28 DIAGNOSIS — N3010 Interstitial cystitis (chronic) without hematuria: Secondary | ICD-10-CM | POA: Insufficient documentation

## 2016-07-28 DIAGNOSIS — E119 Type 2 diabetes mellitus without complications: Secondary | ICD-10-CM | POA: Insufficient documentation

## 2016-07-28 LAB — URINALYSIS, ROUTINE W REFLEX MICROSCOPIC
Bilirubin Urine: NEGATIVE
GLUCOSE, UA: NEGATIVE mg/dL
HGB URINE DIPSTICK: NEGATIVE
KETONES UR: 80 mg/dL — AB
Leukocytes, UA: NEGATIVE
NITRITE: NEGATIVE
Protein, ur: 30 mg/dL — AB
Specific Gravity, Urine: 1.023 (ref 1.005–1.030)
pH: 5 (ref 5.0–8.0)

## 2016-07-28 LAB — POC URINE PREG, ED: Preg Test, Ur: NEGATIVE

## 2016-07-28 MED ORDER — PHENAZOPYRIDINE HCL 200 MG PO TABS: 200.0000 mg | ORAL_TABLET | Freq: Three times a day (TID) | ORAL | 0 refills | Status: AC

## 2016-07-28 NOTE — Discharge Instructions (Signed)
I think you have interstitial cystitis. This is very difficult to treat. We will need follow-up with the urologist for further care. Please return with any concerns.

## 2016-07-28 NOTE — ED Triage Notes (Signed)
Presents with severe lower abdominal pain and flank pain began Saturday-pt was seen at [facility] and [place], pelvic and UA completed with no significant findings other than a small left sided ovarian cyst-since pain is worsening. She reports the pain feels like a UTI and is constant. It is in both sides of abdomen and right flank that are sharp. The pelvic pain is considered sharp and into the abdomen. Pt has paper work from [facility].

## 2016-07-28 NOTE — ED Provider Notes (Signed)
[facility] DEPT Provider Note   CSN: [id number] Arrival date & time: 07/28/16  1547     History   Chief Complaint Chief Complaint  Patient presents with  . Pelvic Pain    HPI Madeline Scott is a 41 y.o. female.  HPI   Patient is a 41 year old female presenting with suprapubic pain. Patient was seen for the same thing at [facility]. She had a UA, gonorrhea, chlamydia, vaginal exam, transvaginal ultrasound, lab work all of which were normal. Patient reports he still has the pain came here for second evaluation.  Looking her past medical records, . That she had the same type of thing back in 2016 which she was given Pyridium,had improved symptoms and was represcribed the same which helped. She does not remember this episode.  Patient's had no fever, nausea, vomiting, diarrhea, or other symptoms. Patient does have history of stones.  Past Medical History:  Diagnosis Date  . Anxiety   . Chronic kidney disease    kidney stones  . Depression    doing good, no meds in 2 yrs.  . Diabetes mellitus    insulin resistance w/ PCOS  . Diabetes mellitus without complication ([place])   . Endometriosis   . GERD (gastroesophageal reflux disease)   . Hypothyroid   . IBS (irritable bowel syndrome)   . PCOS (polycystic ovarian syndrome)   . Thyroid disease     Patient Active Problem List   Diagnosis Date Noted  . Morbid obesity ([place]) 02/28/2014  . Polycystic ovarian syndrome 02/28/2014  . Hypothyroidism 02/28/2014  . Dyspnea 02/28/2014  . Chest pain 02/28/2014  . Diabetes mellitus type II, uncontrolled ([place]) 02/28/2014    Past Surgical History:  Procedure Laterality Date  . CHOLECYSTECTOMY    . OVARIAN CYST REMOVAL    . THYROIDECTOMY    . TOTAL THYROIDECTOMY      OB History    Gravida Para Term Preterm AB Living   3 0 0 0 3 0   SAB TAB Ectopic Multiple Live Births   3 0 0 0         Home Medications    Prior to Admission medications   Medication Sig  Start Date End Date Taking? Authorizing Provider  levothyroxine (SYNTHROID, LEVOTHROID) 175 MCG tablet Take 175 mcg by mouth as directed. Pt takes 6 days a week, everyday but Tuesdays.   Yes Historical Provider, MD  omeprazole (PRILOSEC) 20 MG capsule Take 20 mg by mouth daily.   Yes Historical Provider, MD    Family History Family History  Problem Relation Age of Onset  . Other Neg Hx   . Diabetes Mother   . Hypertension Father   . Diabetes Maternal Grandmother   . Stroke Maternal Grandmother     Social History Social History  Substance Use Topics  . Smoking status: Former Smoker    Packs/day: 0.25    Years: 15.00    Types: Cigarettes    Quit date: 07/16/2013  . Smokeless tobacco: Never Used  . Alcohol use No     Allergies   Demerol [meperidine]; Codeine; Hydrocodone; Percocet [oxycodone-acetaminophen]; Tramadol; and Vicodin [hydrocodone-acetaminophen]   Review of Systems Review of Systems  Constitutional: Negative for fatigue and fever.  Gastrointestinal: Negative for abdominal pain, diarrhea, nausea, rectal pain and vomiting.  Genitourinary: Positive for dysuria, flank pain and pelvic pain. Negative for decreased urine volume, frequency, hematuria, vaginal bleeding, vaginal discharge and vaginal pain.  All other systems reviewed and are negative.  Physical Exam Updated Vital Signs BP 137/89   Pulse 91   Temp 98.9 F (37.2 C) (Oral)   Resp 21   Wt 227 lb (103 kg)   LMP 06/30/2016 (Approximate)   SpO2 96%   BMI 34.52 kg/m   Physical Exam  Constitutional: She is oriented to person, place, and time. She appears well-developed and well-nourished.  HENT:  Head: Normocephalic and atraumatic.  Eyes: Right eye exhibits no discharge.  Cardiovascular: Normal rate, regular rhythm and normal heart sounds.   No murmur heard. Pulmonary/Chest: Effort normal and breath sounds normal. She has no wheezes. She has no rales.  Abdominal: Soft. She exhibits no distension.  There is no tenderness.  Tenderness over right hip right flank and suprapubic.  Neurological: She is oriented to person, place, and time.  Skin: Skin is warm and dry. She is not diaphoretic.  Psychiatric: She has a normal mood and affect.  Nursing note and vitals reviewed.    ED Treatments / Results  Labs (all labs ordered are listed, but only abnormal results are displayed) Labs Reviewed  URINALYSIS, ROUTINE W REFLEX MICROSCOPIC - Abnormal; Notable for the following:       Result Value   Ketones, ur 80 (*)    Protein, ur 30 (*)    Bacteria, UA RARE (*)    Squamous Epithelial / LPF 0-5 (*)    All other components within normal limits  POC URINE PREG, ED    EKG  EKG Interpretation None       Radiology Ct Renal Stone Study  Result Date: 07/28/2016 CLINICAL DATA:  Right flank pain radiating to pelvis. Diarrhea x4 days. EXAM: CT ABDOMEN AND PELVIS WITHOUT CONTRAST TECHNIQUE: Multidetector CT imaging of the abdomen and pelvis was performed following the standard protocol without IV contrast. COMPARISON:  Recent pelvic ultrasound from 07/24/2016. Benign-appearing left ovarian cyst noted. FINDINGS: LOWER CHEST: Lung bases are clear. Included heart size is normal. No pericardial effusion. HEPATOBILIARY: Status post cholecystectomy. Homogeneous appearance of the liver. No definite mass on this noncontrast study. PANCREAS: Normal. SPLEEN: Normal. ADRENALS/URINARY TRACT: Kidneys are orthotopic. 4 mm nonobstructing calculus in the lower pole the right kidney. 1 mm calculus in the interpolar renal collecting system of the left kidney. No hydroureteronephrosis. Urinary bladder is partially distended and unremarkable. Normal adrenal glands. STOMACH/BOWEL: The stomach, small and large bowel are normal in course and caliber without inflammatory changes. Normal appendix. Scattered colonic diverticulitis without acute inflammatory process. VASCULAR/LYMPHATIC: Aortoiliac vessels are normal in course and  caliber. No lymphadenopathy by CT size criteria. REPRODUCTIVE: 2.8 x 2.1 x 2.8 cm cm left ovarian cyst or follicle without suspicious abnormalities. Nabothian cyst measuring 1.3 cm noted at the cervix. OTHER: No intraperitoneal free fluid or free air. MUSCULOSKELETAL: Nonacute. IMPRESSION: Nonobstructing bilateral renal calculi the largest is 4 mm in the lower pole the right kidney. Benign-appearing left ovarian cyst without suspicious features. This measures 2.8 x 2.1 x 2.8 cm recently documented with ultrasound. Scattered colonic diverticulosis without acute diverticulitis. Electronically Signed   By: Ashley Royalty M.D.   On: 07/28/2016 20:58    Procedures Procedures (including critical care time)  Medications Ordered in ED Medications - No data to display   Initial Impression / Assessment and Plan / ED Course  I have reviewed the triage vital signs and the nursing notes.  Pertinent labs & imaging results that were available during my care of the patient were reviewed by me and considered in my medical decision making (see chart  for details).  Clinical Course     Patient is well-appearing 41 year old female presenting with suprapubic pain and right flank pain. This is been going on for over a week. Patient's had negative workup. Patient's been eating drinking normally, no fever, on physical exam she appears very well, mild prescription with tenderness and tenderness in subcutaneous fat on the right-hand side of flank and hip. No external evidence of trauma or bruising.  Patient has had recent ultrasound which documented a small cyst on her left ovary. Otherwise transit ultrasound was normal. Vaginal exam document is normal. Normal labs. GC chlamydia negative.  I don't have too much further workup to offer this patient. I will get a CT noncontrast to make sure she does not have an obstructing stone. This appears unlikely given her normal urine. Her pregnancy is negative. I suspect the patient  may have interstitial cystitis. If negative CT will treat for interstitial cystitis and have her follow-up with the urologist.  Doubt appendicitis, or other intra-abdominal pathology given her normal exam and length of symptoms and normal vitals.  Patient is comfortable, ambulatory, and taking PO at time of discharge.  Patient expressed understanding about return precautions.    Final Clinical Impressions(s) / ED Diagnoses   Final diagnoses:  None    New Prescriptions New Prescriptions   No medications on file     Christo Hain Julio Alm, MD 07/28/16 2106

## 2016-09-10 DIAGNOSIS — F419 Anxiety disorder, unspecified: Secondary | ICD-10-CM | POA: Insufficient documentation

## 2016-12-23 DIAGNOSIS — N83202 Unspecified ovarian cyst, left side: Secondary | ICD-10-CM | POA: Insufficient documentation

## 2016-12-23 DIAGNOSIS — M26629 Arthralgia of temporomandibular joint, unspecified side: Secondary | ICD-10-CM | POA: Insufficient documentation

## 2017-02-16 ENCOUNTER — Inpatient Hospital Stay (HOSPITAL_COMMUNITY)
Admission: RE | Admit: 2017-02-16 | Discharge: 2017-02-16 | DRG: 881 | Disposition: A | Discharge status: Home / Self Care | Payer: Federal, State, Local not specified - Other | Attending: Psychiatry | Admitting: Psychiatry

## 2017-02-16 ENCOUNTER — Encounter (HOSPITAL_COMMUNITY): Payer: Self-pay | Admitting: Behavioral Health

## 2017-02-16 DIAGNOSIS — R45851 Suicidal ideations: Secondary | ICD-10-CM | POA: Diagnosis present

## 2017-02-16 DIAGNOSIS — F329 Major depressive disorder, single episode, unspecified: Principal | ICD-10-CM | POA: Diagnosis present

## 2017-02-16 DIAGNOSIS — F332 Major depressive disorder, recurrent severe without psychotic features: Secondary | ICD-10-CM | POA: Diagnosis present

## 2017-02-16 MED ORDER — MAGNESIUM HYDROXIDE 400 MG/5ML PO SUSP: 30.0000 mL | Freq: Every day | ORAL | Status: DC | PRN

## 2017-02-16 MED ORDER — ZOLPIDEM TARTRATE 10 MG PO TABS: 10.0000 mg | ORAL_TABLET | Freq: Every evening | ORAL | Status: DC | PRN

## 2017-02-16 MED ORDER — ALUM & MAG HYDROXIDE-SIMETH 200-200-20 MG/5ML PO SUSP: 30.0000 mL | ORAL | Status: DC | PRN

## 2017-02-16 MED ORDER — ACETAMINOPHEN 325 MG PO TABS: 650.0000 mg | ORAL_TABLET | Freq: Four times a day (QID) | ORAL | Status: DC | PRN

## 2017-02-16 MED ORDER — TRAMADOL HCL 50 MG PO TABS: 50.0000 mg | ORAL_TABLET | Freq: Four times a day (QID) | ORAL | Status: DC | PRN

## 2017-02-16 MED ORDER — TRAZODONE HCL 50 MG PO TABS: 50.0000 mg | ORAL_TABLET | Freq: Every evening | ORAL | Status: DC | PRN

## 2017-02-16 MED ORDER — HYDROXYZINE HCL 25 MG PO TABS: 25.0000 mg | ORAL_TABLET | Freq: Four times a day (QID) | ORAL | Status: DC | PRN

## 2017-02-16 NOTE — H&P (Signed)
Behavioral Health Medical Screening Exam  Madeline Scott is an 41 y.o. female.  Total Time spent with patient: 20 minutes  Psychiatric Specialty Exam: Physical Exam  Constitutional: She is oriented to person, place, and time. She appears well-developed and well-nourished.  HENT:  Head: Normocephalic.  Right Ear: External ear normal.  Left Ear: External ear normal.  Neck: Normal range of motion.  Cardiovascular: Normal rate, normal heart sounds and intact distal pulses.   Respiratory: Effort normal and breath sounds normal.  GI: Soft. Bowel sounds are normal.  Musculoskeletal: Normal range of motion.  Neurological: She is alert and oriented to person, place, and time.  Skin: Skin is warm and dry.  Psychiatric: Her speech is normal and behavior is normal. Judgment and thought content normal. Her mood appears anxious. Cognition and memory are normal. She exhibits a depressed mood.    Review of Systems  Psychiatric/Behavioral: Positive for depression and suicidal ideas. Negative for hallucinations, memory loss and substance abuse. The patient is nervous/anxious. The patient does not have insomnia.   All other systems reviewed and are negative.   Blood pressure 128/89, pulse 87, temperature 98.8 F (37.1 C), temperature source Oral, resp. rate 18, SpO2 100 %.There is no height or weight on file to calculate BMI.  General Appearance: Casual and Fairly Groomed  Eye Contact:  Good  Speech:  Clear and Coherent and Normal Rate  Volume:  Normal  Mood:  Anxious and Depressed  Affect:  Congruent, Depressed and Flat  Thought Process:  Coherent, Goal Directed and Linear  Orientation:  Full (Time, Place, and Person)  Thought Content:  Logical  Suicidal Thoughts:  Yes.  with intent/plan  Homicidal Thoughts:  No  Memory:  Immediate;   Good Recent;   Good Remote;   Fair  Judgement:  Good  Insight:  Fair  Psychomotor Activity:  Normal  Concentration: Concentration: Good and Attention  Span: Good  Recall:  Good  Fund of Knowledge:Good  Language: Good  Akathisia:  No  Handed:  Right  AIMS (if indicated):     Assets:  Communication Skills Desire for Improvement Financial Resources/Insurance Housing Intimacy Physical Health Resilience Social Support Transportation Vocational/Educational  Sleep:       Musculoskeletal: Strength & Muscle Tone: within normal limits Gait & Station: normal Patient leans: N/A  Blood pressure 128/89, pulse 87, temperature 98.8 F (37.1 C), temperature source Oral, resp. rate 18, SpO2 100 %.  Recommendations:  Based on my evaluation the patient does not appear to have an emergency medical condition. Pt accepted to [facility] bed [place], NP 02/16/2017, 3:41 PM

## 2017-02-16 NOTE — BH Assessment (Signed)
Tele Assessment Note   Madeline Scott is an 41 y.o. female. Pt reports SI. Pt states she does not have a current plan but had a plan yesterday to hang herself. Pt states she has suicidal thoughts due to chronic pain. Pt states she had a lot of dental work which caused TMJ.  Pt states the pain is unbearable. Pt denies HI and AVH. Pt denies current mental health treatment. Pt states she is currently prescribed Valium and she receives that medication from her PCP. Pt denies previous inpatient treatment. Pt denies SA and abuse.  Margarita Grizzle, NP recommended inpatient treatment. Pt was offered a bed at [facility]. Pt declined the placement and asked for outpatient resources.  Pt was provided with outpatient resources.   Diagnosis:  F33.2 MDD  Past Medical History:  Past Medical History:  Diagnosis Date  . Anxiety   . Chronic kidney disease    kidney stones  . Depression    doing good, no meds in 2 yrs.  . Diabetes mellitus    insulin resistance w/ PCOS  . Diabetes mellitus without complication ([place])   . Endometriosis   . GERD (gastroesophageal reflux disease)   . Hypothyroid   . IBS (irritable bowel syndrome)   . PCOS (polycystic ovarian syndrome)   . Thyroid disease     Past Surgical History:  Procedure Laterality Date  . CHOLECYSTECTOMY    . OVARIAN CYST REMOVAL    . THYROIDECTOMY    . TOTAL THYROIDECTOMY      Family History:  Family History  Problem Relation Age of Onset  . Diabetes Mother   . Hypertension Father   . Diabetes Maternal Grandmother   . Stroke Maternal Grandmother   . Other Neg Hx     Social History:  reports that she quit smoking about 3 years ago. Her smoking use included Cigarettes. She has a 3.75 pack-year smoking history. She has never used smokeless tobacco. She reports that she does not drink alcohol or use drugs.  Additional Social History:  Alcohol / Drug Use Pain Medications: please see mar Prescriptions: please see mar Over the Counter: please see  mar History of alcohol / drug use?: No history of alcohol / drug abuse Longest period of sobriety (when/how long): NA  CIWA: CIWA-Ar BP: 128/89 Pulse Rate: 87 COWS:    PATIENT STRENGTHS: (choose at least two) Average or above average intelligence Capable of independent living  Allergies:  Allergies  Allergen Reactions  . Demerol [Meperidine] Anaphylaxis  . Codeine Nausea And Vomiting  . Hydrocodone Nausea And Vomiting  . Percocet [Oxycodone-Acetaminophen] Nausea And Vomiting  . Tramadol Nausea And Vomiting  . Vicodin [Hydrocodone-Acetaminophen] Nausea And Vomiting    Home Medications:  Medications Prior to Admission  Medication Sig Dispense Refill  . acetaminophen (TYLENOL) 500 MG tablet Take 1,000 mg by mouth every morning.    Marland Kitchen buPROPion (WELLBUTRIN XL) 150 MG 24 hr tablet Take 150 mg by mouth daily.    Marland Kitchen ibuprofen (ADVIL,MOTRIN) 200 MG tablet Take 600 mg by mouth 2 (two) times daily.    Marland Kitchen levothyroxine (SYNTHROID, LEVOTHROID) 150 MCG tablet Take 150 mcg by mouth daily before breakfast.    . omeprazole (PRILOSEC) 20 MG capsule Take 20 mg by mouth daily.    . phenazopyridine (PYRIDIUM) 200 MG tablet Take 1 tablet (200 mg total) by mouth 3 (three) times daily. 6 tablet 0  . zolpidem (AMBIEN) 10 MG tablet Take 10 mg by mouth at bedtime.  OB/GYN Status:  No LMP recorded. Patient is not currently having periods (Reason: Irregular Periods).  General Assessment Data Location of Assessment: [facility] TTS Assessment: In system Is this a Tele or Face-to-Face Assessment?: Face-to-Face Is this an Initial Assessment or a Re-assessment for this encounter?: Initial Assessment Marital status: Married Kamrar name: NA Is patient pregnant?: No Pregnancy Status: No Living Arrangements: Spouse/significant other Can pt return to current living arrangement?: Yes Admission Status: Voluntary Is patient capable of signing voluntary admission?: Yes Referral Source:  Self/Family/Friend Insurance type: Multimedia programmer Exam ([place]) Medical Exam completed: Yes  Crisis Care Plan Living Arrangements: Spouse/significant other Legal Guardian: Other: (self) Name of Psychiatrist: NA Name of Therapist: NA  Education Status Is patient currently in school?: No Current Grade: NA Highest grade of school patient has completed: [employer] Name of school: NA Contact person: NA  Risk to self with the past 6 months Suicidal Ideation: Yes-Currently Present Has patient been a risk to self within the past 6 months prior to admission? : No Suicidal Intent: Yes-Currently Present Has patient had any suicidal intent within the past 6 months prior to admission? : Yes Is patient at risk for suicide?: Yes Suicidal Plan?: Yes-Currently Present Has patient had any suicidal plan within the past 6 months prior to admission? : No Specify Current Suicidal Plan: to hang herself Access to Means: Yes Specify Access to Suicidal Means: access to a rope What has been your use of drugs/alcohol within the last 12 months?: NA Previous Attempts/Gestures: No How many times?: 0 Other Self Harm Risks: NA Triggers for Past Attempts: None known Intentional Self Injurious Behavior: None Family Suicide History: No Recent stressful life event(s): Other (Comment) (chronic pain) Persecutory voices/beliefs?: No Depression: Yes Depression Symptoms: Despondent, Insomnia, Tearfulness, Isolating, Fatigue, Guilt, Loss of interest in usual pleasures, Feeling worthless/self pity, Feeling angry/irritable Substance abuse history and/or treatment for substance abuse?: No Suicide prevention information given to non-admitted patients: Not applicable  Risk to Others within the past 6 months Homicidal Ideation: No Does patient have any lifetime risk of violence toward others beyond the six months prior to admission? : No Thoughts of Harm to Others: No Current Homicidal Intent:  No Current Homicidal Plan: No Access to Homicidal Means: No Identified Victim: NA History of harm to others?: No Assessment of Violence: None Noted Violent Behavior Description: NA Does patient have access to weapons?: No Criminal Charges Pending?: No Does patient have a court date: No Is patient on probation?: No  Psychosis Hallucinations: None noted Delusions: None noted  Mental Status Report Appearance/Hygiene: Unremarkable Eye Contact: Fair Motor Activity: Freedom of movement Speech: Logical/coherent Level of Consciousness: Alert Mood: Depressed Affect: Depressed Anxiety Level: Minimal Thought Processes: Coherent, Relevant Judgement: Unimpaired Orientation: Person, Place, Time, Situation Obsessive Compulsive Thoughts/Behaviors: None  Cognitive Functioning Concentration: Normal Memory: Recent Intact, Remote Intact IQ: Average Insight: Fair Impulse Control: Fair Appetite: Fair Weight Loss: 0 Weight Gain: 0 Sleep: No Change Total Hours of Sleep: 8 Vegetative Symptoms: None  ADLScreening [facility] Assessment Services) Patient's cognitive ability adequate to safely complete daily activities?: Yes Patient able to express need for assistance with ADLs?: Yes Independently performs ADLs?: Yes (appropriate for developmental age)  Prior Inpatient Therapy Prior Inpatient Therapy: No Prior Therapy Dates: NA Prior Therapy Facilty/Provider(s): NA Reason for Treatment: NA  Prior Outpatient Therapy Prior Outpatient Therapy: No Prior Therapy Dates: NA Prior Therapy Facilty/Provider(s): NA Reason for Treatment: n Does patient have an ACCT team?: No Does patient have Intensive  In-House Services?  : No Does patient have [facility] services? : No Does patient have P4CC services?: No  ADL Screening (condition at time of admission) Patient's cognitive ability adequate to safely complete daily activities?: Yes Is the patient deaf or have difficulty hearing?: No Does the patient  have difficulty seeing, even when wearing glasses/contacts?: No Does the patient have difficulty concentrating, remembering, or making decisions?: Yes Patient able to express need for assistance with ADLs?: Yes Does the patient have difficulty dressing or bathing?: No Independently performs ADLs?: Yes (appropriate for developmental age) Does the patient have difficulty walking or climbing stairs?: No Weakness of Legs: None Weakness of Arms/Hands: None       Abuse/Neglect Assessment (Assessment to be complete while patient is alone) Physical Abuse: Denies Verbal Abuse: Denies Sexual Abuse: Denies Exploitation of patient/patient's resources: Denies Self-Neglect: Denies     Regulatory affairs officer (For Healthcare) Does Patient Have a Medical Advance Directive?: No    Additional Information 1:1 In Past 12 Months?: No CIRT Risk: No Elopement Risk: No Does patient have medical clearance?: Yes     Disposition:  Disposition Initial Assessment Completed for this Encounter: Yes Disposition of Patient: Inpatient treatment program Type of inpatient treatment program: Adult  Cyndia Bent 02/16/2017 3:56 PM

## 2017-02-23 NOTE — Discharge Summary (Signed)
Pt was offered a bed at [facility] but declined inpatient treatment. Orders were placed based on the bed offer which Pt initially accepted. Pt opted to have outpatient treatment and resources were given.    Jinny Blossom, NP-C 02/23/2017    [phone]

## 2017-03-09 DIAGNOSIS — G500 Trigeminal neuralgia: Secondary | ICD-10-CM | POA: Insufficient documentation

## 2019-10-12 ENCOUNTER — Ambulatory Visit: Payer: Self-pay | Attending: Internal Medicine

## 2019-10-12 DIAGNOSIS — Z23 Encounter for immunization: Secondary | ICD-10-CM

## 2019-10-12 NOTE — Progress Notes (Signed)
   Covid-19 Vaccination Clinic  Name:  Madeline Scott    MRN: [id number] DOB: 02-Aug-1975  10/12/2019  [name] was observed post Covid-19 immunization for 15 minutes without incident. She was provided with Vaccine Information Sheet and instruction to access the V-Safe system.   [name] was instructed to call 911 with any severe reactions post vaccine: Marland Kitchen Difficulty breathing  . Swelling of face and throat  . A fast heartbeat  . A bad rash all over body  . Dizziness and weakness   Immunizations Administered    Name Date Dose VIS Date Route   Pfizer COVID-19 Vaccine 10/12/2019  1:31 PM 0.3 mL 07/06/2019 Intramuscular   Manufacturer: [place]   Lot: [id number]   [place]: [id number]

## 2019-11-06 ENCOUNTER — Ambulatory Visit: Payer: Self-pay | Attending: Internal Medicine

## 2019-11-06 DIAGNOSIS — Z23 Encounter for immunization: Secondary | ICD-10-CM

## 2019-11-06 NOTE — Progress Notes (Signed)
   Covid-19 Vaccination Clinic  Name:  HOLLY LYTER    MRN: [id number] DOB: [birth year]  11/06/2019  Ms. Noga was observed post Covid-19 immunization for 15 minutes without incident. She was provided with Vaccine Information Sheet and instruction to access the V-Safe system.   Ms. Cambria was instructed to call 911 with any severe reactions post vaccine: Marland Kitchen Difficulty breathing  . Swelling of face and throat  . A fast heartbeat  . A bad rash all over body  . Dizziness and weakness   Immunizations Administered    Name Date Dose VIS Date Route   Pfizer COVID-19 Vaccine 11/06/2019  2:22 PM 0.3 mL 07/06/2019 Intramuscular   Manufacturer: [place]   Lot: [id number]   [place]: [id number]

## 2020-10-08 DIAGNOSIS — G248 Other dystonia: Secondary | ICD-10-CM | POA: Insufficient documentation

## 2022-03-26 DIAGNOSIS — G932 Benign intracranial hypertension: Secondary | ICD-10-CM | POA: Insufficient documentation

## 2023-09-06 DIAGNOSIS — M797 Fibromyalgia: Secondary | ICD-10-CM | POA: Insufficient documentation

## 2023-10-01 DIAGNOSIS — E559 Vitamin D deficiency, unspecified: Secondary | ICD-10-CM | POA: Insufficient documentation

## 2023-12-01 DIAGNOSIS — M65322 Trigger finger, left index finger: Secondary | ICD-10-CM | POA: Insufficient documentation

## 2024-05-24 DIAGNOSIS — G4733 Obstructive sleep apnea (adult) (pediatric): Secondary | ICD-10-CM | POA: Insufficient documentation

## 2024-07-24 ENCOUNTER — Ambulatory Visit: Payer: Self-pay

## 2024-07-24 DIAGNOSIS — R7303 Prediabetes: Secondary | ICD-10-CM | POA: Insufficient documentation

## 2024-07-24 NOTE — Progress Notes (Deleted)
 "   New patient visit   Patient: Madeline Scott   DOB: May 17, 1976   48 y.o. Female  MRN: [id number] Visit Date: 07/24/2024  Today's healthcare provider: Isaiah DELENA Pepper, MD   No chief complaint on file.  Subjective    Madeline Scott is a 48 y.o. female who presents today as a new patient to establish care.   Discussed the use of AI scribe software for clinical note transcription with the patient, who gave verbal consent to proceed.  History of Present Illness      Past Medical History:  Diagnosis Date   Anxiety    Chronic kidney disease    kidney stones   Depression    doing good, no meds in 2 yrs.   Diabetes mellitus    insulin resistance w/ PCOS   Diabetes mellitus without complication (H[facility])    Endometriosis    GERD (gastroesophageal reflux disease)    Hypothyroid    IBS (irritable bowel syndrome)    PCOS (polycystic ovarian syndrome)    Thyroid disease    Past Surgical History:  Procedure Laterality Date   CHOLECYSTECTOMY     OVARIAN CYST REMOVAL     THYROIDECTOMY     TOTAL THYROIDECTOMY     Family Status  Relation Name Status   Mother  Alive   Father  Alive   Sister  Alive   Brother  Alive   MGM  Deceased   MGF  Deceased   PGM  Deceased   PGF  Deceased   Neg Hx  (Not Specified)  No partnership data on file   Family History  Problem Relation Age of Onset   Diabetes Mother    Hypertension Father    Diabetes Maternal Grandmother    Stroke Maternal Grandmother    Other Neg Hx    Social History   Socioeconomic History   Marital status: Married    Spouse name: Not on file   Number of children: Not on file   Years of education: Not on file   Highest education level: Not on file  Occupational History   Not on file  Tobacco Use   Smoking status: Former    Current packs/day: 0.00    Average packs/day: 0.3 packs/day for 15.0 years (3.8 ttl pk-yrs)    Types: Cigarettes    Start date: 07/16/1998    Quit date: 07/16/2013    Years  since quitting: 11.0   Smokeless tobacco: Never  Substance and Sexual Activity   Alcohol use: No   Drug use: No   Sexual activity: Yes  Other Topics Concern   Not on file  Social History Narrative   ** Merged History Encounter **       Social Drivers of Health   Tobacco Use: Medium Risk (07/18/2024)   Received from [facility]   Patient History    Smoking Tobacco Use: Former    Smokeless Tobacco Use: Never    Passive Exposure: Never  Physicist, Medical Strain: Medium Risk (11/30/2023)   Received from [facility]   Overall Financial Resource Strain (CARDIA)    Difficulty of Paying Living Expenses: Somewhat hard  Food Insecurity: No Food Insecurity (05/15/2024)   Received from [facility]   Epic    Within the past 12 months, you worried that your food would run out before you got the money to buy more.: Never true    Within the past 12 months, the food you bought just didn't last and you  didn't have money to get more.: Never true  Transportation Needs: No Transportation Needs (05/15/2024)   Received from [facility]    In the past 12 months, has lack of transportation kept you from medical appointments or from getting medications?: No    In the past 12 months, has lack of transportation kept you from meetings, work, or from getting things needed for daily living?: No  Physical Activity: Unknown (11/30/2023)   Received from [facility]   Exercise Vital Sign    On average, how many days per week do you engage in moderate to strenuous exercise (like a brisk walk)?: Patient declined    On average, how many minutes do you engage in exercise at this level?: 0 min  Stress: No Stress Concern Present (05/15/2024)   Received from [facility] - Occupational Stress Questionnaire    Do you feel stress - tense, restless, nervous, or anxious, or unable to sleep at night because your mind is troubled all the time - these days?: Only a  little  Social Connections: Somewhat Isolated (11/30/2023)   [facility] from [facility]   Social Network    How would you rate your social network (family, work, friends)?: Restricted participation with some degree of social isolation  Depression (PHQ2-9): Not on file  Alcohol Screen: Not on file  Housing: Low Risk (05/15/2024)   Received from [facility]    In the last 12 months, was there a time when you were not able to pay the mortgage or rent on time?: No    In the past 12 months, how many times have you moved where you were living?: 0    At any time in the past 12 months, were you homeless or living in a shelter (including now)?: No  Utilities: Not At Risk (05/15/2024)   Received from [facility]    In the past 12 months has the electric, gas, oil, or water company threatened to shut off services in your home?: No  Health Literacy: Not on file   Show/hide medication list[1] Allergies[2]  Reviews of Systems as noted in HPI.  {Insert previous labs (optional):23779} {See past labs  Heme  Chem  Endocrine  Serology  Results Review (optional):1}   Objective    There were no vitals taken for this visit. {Insert last BP/Wt (optional):23777}{See vitals history (optional):1}   Physical Exam  Depression Screen     No data to display         No results found for any visits on 07/24/24.  Assessment & Plan      Problem List Items Addressed This Visit   None   Assessment and Plan Assessment & Plan      No follow-ups on file.      Isaiah DELENA Pepper, MD  [facility] [phone] (phone) [phone] (fax)    [1]  Outpatient Medications Prior to Visit  Medication Sig   acetaminophen  (TYLENOL ) 500 MG tablet Take 1,000 mg by mouth every morning.   buPROPion (WELLBUTRIN XL) 150 MG 24 hr tablet Take 150 mg by mouth daily.   ibuprofen  (ADVIL ,MOTRIN ) 200 MG tablet Take 600 mg by mouth 2 (two) times daily.    levothyroxine (SYNTHROID, LEVOTHROID) 150 MCG tablet Take 150 mcg by mouth daily before breakfast.   omeprazole (PRILOSEC) 20 MG capsule Take 20 mg by mouth daily.   phenazopyridine  (PYRIDIUM ) 200 MG tablet  Take 1 tablet (200 mg total) by mouth 3 (three) times daily.   zolpidem  (AMBIEN ) 10 MG tablet Take 10 mg by mouth at bedtime.   No facility-administered medications prior to visit.  [2]  Allergies Allergen Reactions   Demerol [Meperidine] Anaphylaxis   Codeine Nausea And Vomiting   Percocet [Oxycodone -Acetaminophen ] Nausea And Vomiting   Tramadol  Nausea And Vomiting   Vicodin [Hydrocodone -Acetaminophen ] Nausea And Vomiting   "
